# Patient Record
Sex: Male | Born: 1970 | Race: Black or African American | Marital: Single | Smoking: Current every day smoker
Health system: Southern US, Community
[De-identification: ages and names within clinical notes are randomized; demographics above are authoritative.]

## PROBLEM LIST (undated history)

## (undated) DIAGNOSIS — F209 Schizophrenia, unspecified: Secondary | ICD-10-CM

## (undated) DIAGNOSIS — F319 Bipolar disorder, unspecified: Secondary | ICD-10-CM

## (undated) HISTORY — DX: Bipolar disorder, unspecified: F31.9

## (undated) HISTORY — DX: Schizophrenia, unspecified: F20.9

---

## 2020-10-14 ENCOUNTER — Encounter: Payer: Self-pay | Admitting: Internal Medicine

## 2020-10-14 ENCOUNTER — Other Ambulatory Visit: Payer: Self-pay

## 2020-10-14 ENCOUNTER — Ambulatory Visit (INDEPENDENT_AMBULATORY_CARE_PROVIDER_SITE_OTHER): Payer: Self-pay | Admitting: Internal Medicine

## 2020-10-14 VITALS — BP 118/87 | HR 78 | Ht 65.0 in | Wt 199.6 lb

## 2020-10-14 DIAGNOSIS — Z6833 Body mass index (BMI) 33.0-33.9, adult: Secondary | ICD-10-CM

## 2020-10-14 DIAGNOSIS — F3176 Bipolar disorder, in full remission, most recent episode depressed: Secondary | ICD-10-CM

## 2020-10-14 DIAGNOSIS — E6609 Other obesity due to excess calories: Secondary | ICD-10-CM

## 2020-10-14 DIAGNOSIS — F209 Schizophrenia, unspecified: Secondary | ICD-10-CM

## 2020-10-14 NOTE — Assessment & Plan Note (Signed)
-   I encouraged the patient to lose weight.  - I educated them on making healthy dietary choices including eating more fruits and vegetables and less fried foods. - I encouraged the patient to exercise more, and educated on the benefits of exercise including weight loss, diabetes prevention, and hypertension prevention.   

## 2020-10-14 NOTE — Assessment & Plan Note (Signed)
Patient is under psychiatric care.

## 2020-10-14 NOTE — Assessment & Plan Note (Signed)
Patient does not seem to be hyper or depressed.  He denies any chest pain or shortness of breath.  There is no swelling or pedal edema.  Behavior is normal.  There is no flight of ideas or hallucinations noted.

## 2020-10-14 NOTE — Progress Notes (Signed)
New Patient Office Visit  Subjective:  Patient ID: Drew Moss, male    DOB: February 22, 1971  Age: 50 y.o. MRN: [id number]  CC:  Chief Complaint  Patient presents with  . New Patient (Initial Visit)    HPI   patient was seen in the office as a new patient he has been living in the restroom.  Prior to that he was seen in the [facility] , He is taking Abilify 5 mg tablet p.o. daily and Zyprexa 20 mg p.o. daily.  He smokes 1 pack/day.  Denies any history of drinking alcohol.  He denies any chest pain or shortness of breath.  Past Medical History:  Diagnosis Date  . Bipolar affective ([place])   . Schizophrenia ([place])      Current Outpatient Medications:  .  acetaminophen (TYLENOL) 325 MG tablet, Take 650 mg by mouth 3 (three) times daily., Disp: , Rfl:  .  ARIPiprazole (ABILIFY) 5 MG tablet, Take 5 mg by mouth daily., Disp: , Rfl:  .  OLANZapine (ZYPREXA) 20 MG tablet, Take 20 mg by mouth at bedtime., Disp: , Rfl:    History reviewed. No pertinent surgical history.  History reviewed. No pertinent family history.  Social History   Socioeconomic History  . Marital status: Single    Spouse name: Not on file  . Number of children: Not on file  . Years of education: Not on file  . Highest education level: Not on file  Occupational History  . Not on file  Tobacco Use  . Smoking status: Current Every Day Smoker  . Smokeless tobacco: Never Used  Substance and Sexual Activity  . Alcohol use: Not Currently  . Drug use: Never  . Sexual activity: Not Currently  Other Topics Concern  . Not on file  Social History Narrative  . Not on file   Social Determinants of Health   Financial Resource Strain: Not on file  Food Insecurity: Not on file  Transportation Needs: Not on file  Physical Activity: Not on file  Stress: Not on file  Social Connections: Not on file  Intimate Partner Violence: Not on file    ROS Review of Systems  Constitutional: Negative.   HENT: Negative.    Eyes: Negative.   Respiratory: Negative.   Cardiovascular: Negative.   Gastrointestinal: Negative.   Endocrine: Negative.   Genitourinary: Negative.   Musculoskeletal: Negative.   Skin: Negative.   Allergic/Immunologic: Negative.   Neurological: Negative.   Hematological: Negative.   Psychiatric/Behavioral: Negative.   All other systems reviewed and are negative.   Objective:   Today's Vitals: BP 118/87   Pulse 78   Ht 5\' 5"  (1.651 m)   Wt 199 lb 9.6 oz (90.5 kg)   BMI 33.22 kg/m   Physical Exam  Assessment & Plan:   Problem List Items Addressed This Visit      Other   Schizophrenia in remission [facility]) - Primary    Patient is under psychiatric care.      Bipolar 1 disorder, depressed, full remission ([place])    Patient does not seem to be hyper or depressed.  He denies any chest pain or shortness of breath.  There is no swelling or pedal edema.  Behavior is normal.  There is no flight of ideas or hallucinations noted.      Class 1 obesity due to excess calories without serious comorbidity with body mass index (BMI) of 33.0 to 33.9 in adult    - I encouraged the patient  to lose weight.  - I educated them on making healthy dietary choices including eating more fruits and vegetables and less fried foods. - I encouraged the patient to exercise more, and educated on the benefits of exercise including weight loss, diabetes prevention, and hypertension prevention.           Outpatient Encounter Medications as of 10/14/2020  Medication Sig  . acetaminophen (TYLENOL) 325 MG tablet Take 650 mg by mouth 3 (three) times daily.  . ARIPiprazole (ABILIFY) 5 MG tablet Take 5 mg by mouth daily.  Marland Kitchen OLANZapine (ZYPREXA) 20 MG tablet Take 20 mg by mouth at bedtime.   No facility-administered encounter medications on file as of 10/14/2020.    Follow-up: No follow-ups on file.   Cletis Athens, MD

## 2021-01-19 ENCOUNTER — Encounter: Payer: Self-pay | Admitting: Internal Medicine

## 2021-01-19 ENCOUNTER — Other Ambulatory Visit: Payer: Self-pay

## 2021-01-19 ENCOUNTER — Ambulatory Visit (INDEPENDENT_AMBULATORY_CARE_PROVIDER_SITE_OTHER): Payer: Medicare Other | Admitting: Internal Medicine

## 2021-01-19 VITALS — BP 111/92 | HR 82 | Ht 65.0 in | Wt 211.5 lb

## 2021-01-19 DIAGNOSIS — F209 Schizophrenia, unspecified: Secondary | ICD-10-CM

## 2021-01-19 DIAGNOSIS — E6609 Other obesity due to excess calories: Secondary | ICD-10-CM

## 2021-01-19 DIAGNOSIS — F3176 Bipolar disorder, in full remission, most recent episode depressed: Secondary | ICD-10-CM | POA: Diagnosis not present

## 2021-01-19 DIAGNOSIS — F17200 Nicotine dependence, unspecified, uncomplicated: Secondary | ICD-10-CM | POA: Diagnosis not present

## 2021-01-19 DIAGNOSIS — Z6833 Body mass index (BMI) 33.0-33.9, adult: Secondary | ICD-10-CM

## 2021-01-19 NOTE — Assessment & Plan Note (Signed)
Stable at the present time. 

## 2021-01-19 NOTE — Assessment & Plan Note (Signed)
No abnormal behaviors.

## 2021-01-19 NOTE — Assessment & Plan Note (Signed)

## 2021-01-19 NOTE — Progress Notes (Signed)
Established Patient Office Visit  Subjective:  Patient ID: Drew Moss, male    DOB: [birth year]  Age: 50 y.o. MRN: [id number]  CC:  Chief Complaint  Patient presents with   Follow-up    HPI  Drew Moss presents for check up Past Medical History:  Diagnosis Date   Bipolar affective ([place])    Schizophrenia ([place])     History reviewed. No pertinent surgical history.  History reviewed. No pertinent family history.  Social History   Socioeconomic History   Marital status: Single    Spouse name: Not on file   Number of children: Not on file   Years of education: Not on file   Highest education level: Not on file  Occupational History   Not on file  Tobacco Use   Smoking status: Every Day    Pack years: 0.00   Smokeless tobacco: Never  Substance and Sexual Activity   Alcohol use: Not Currently   Drug use: Never   Sexual activity: Not Currently  Other Topics Concern   Not on file  Social History Narrative   Not on file   Social Determinants of Health   Financial Resource Strain: Not on file  Food Insecurity: Not on file  Transportation Needs: Not on file  Physical Activity: Not on file  Stress: Not on file  Social Connections: Not on file  Intimate Partner Violence: Not on file     Current Outpatient Medications:    acetaminophen (TYLENOL) 325 MG tablet, Take 650 mg by mouth 3 (three) times daily., Disp: , Rfl:    ARIPiprazole (ABILIFY) 5 MG tablet, Take 5 mg by mouth daily., Disp: , Rfl:    OLANZapine (ZYPREXA) 20 MG tablet, Take 20 mg by mouth at bedtime., Disp: , Rfl:    No Known Allergies  ROS Review of Systems  Constitutional: Negative.   HENT: Negative.    Eyes: Negative.   Respiratory: Negative.    Cardiovascular: Negative.   Gastrointestinal: Negative.   Endocrine: Negative.   Genitourinary: Negative.   Musculoskeletal: Negative.   Skin: Negative.   Allergic/Immunologic: Negative.   Neurological: Negative.   Hematological:  Negative.   Psychiatric/Behavioral: Negative.    All other systems reviewed and are negative.    Objective:    Physical Exam Vitals reviewed.  Constitutional:      Appearance: Normal appearance.  HENT:     Mouth/Throat:     Mouth: Mucous membranes are moist.  Eyes:     Pupils: Pupils are equal, round, and reactive to light.  Neck:     Vascular: No carotid bruit.  Cardiovascular:     Rate and Rhythm: Normal rate and regular rhythm.     Pulses: Normal pulses.     Heart sounds: Normal heart sounds.  Pulmonary:     Effort: Pulmonary effort is normal.     Breath sounds: Normal breath sounds.  Abdominal:     General: Bowel sounds are normal.     Palpations: Abdomen is soft. There is no hepatomegaly, splenomegaly or mass.     Tenderness: There is no abdominal tenderness.     Hernia: No hernia is present.  Musculoskeletal:     Cervical back: Neck supple.     Right lower leg: No edema.     Left lower leg: No edema.  Skin:    Findings: No rash.  Neurological:     Mental Status: He is alert and oriented to person, place, and time.     Motor: No  weakness.  Psychiatric:        Mood and Affect: Mood normal.        Behavior: Behavior normal.    BP (!) 111/92   Pulse 82   Ht 5' 5"  (1.651 m)   Wt 211 lb 8 oz (95.9 kg)   BMI 35.20 kg/m  Wt Readings from Last 3 Encounters:  01/19/21 211 lb 8 oz (95.9 kg)  10/14/20 199 lb 9.6 oz (90.5 kg)     Health Maintenance Due  Topic Date Due   COVID-19 Vaccine (1) Never done   Pneumococcal Vaccine 50-31 Years old (1 - PCV) Never done   HIV Screening  Never done   Hepatitis C Screening  Never done   TETANUS/TDAP  Never done   COLONOSCOPY (Pts 45-40yr Insurance coverage will need to be confirmed)  Never done    There are no preventive care reminders to display for this patient.  No results found for: TSH No results found for: WBC, HGB, HCT, MCV, PLT No results found for: NA, K, CHLORIDE, CO2, GLUCOSE, BUN, CREATININE, BILITOT,  ALKPHOS, AST, ALT, PROT, ALBUMIN, CALCIUM, ANIONGAP, EGFR, GFR No results found for: CHOL No results found for: HDL No results found for: LDLCALC No results found for: TRIG No results found for: CHOLHDL No results found for: HGBA1C    Assessment & Plan:   Problem List Items Addressed This Visit       Other   [place]ia in [place]    No abnormal behaviors.       Bipolar 1 disorder, depressed, full remission ([place] - Primary    Stable at the present time       Class 1 obesity due to excess calories without serious comorbidity with body mass index (BMI) of 33.0 to 33.9 in adult    - I encouraged the patient to lose weight.  - I educated them on making healthy dietary choices including eating more fruits and vegetables and less fried foods. - I encouraged the patient to exercise more, and educated on the benefits of exercise including weight loss, diabetes prevention, and hypertension prevention.   Dietary counseling with a registered dietician  Referral to a weight management support group (e.g. Weight Watchers, Overeaters Anonymous)  If your BMI is greater than 29 or you have gained more than 15 pounds you should work on weight loss.  Attend a healthy cooking class        Smoker    - I instructed the patient to stop smoking and provided them with smoking cessation materials.  - I informed the patient that smoking puts them at increased risk for cancer, COPD, hypertension, and more.  - Informed the patient to seek help if they begin to have trouble breathing, develop chest pain, start to cough up blood, feel faint, or pass out.        No orders of the defined types were placed in this encounter.   Follow-up: No follow-ups on file.    JCletis Athens MD

## 2021-01-19 NOTE — Assessment & Plan Note (Signed)
-   I instructed the patient to stop smoking and provided them with smoking cessation materials.  - I informed the patient that smoking puts them at increased risk for cancer, COPD, hypertension, and more.  - Informed the patient to seek help if they begin to have trouble breathing, develop chest pain, start to cough up blood, feel faint, or pass out.  

## 2021-04-26 ENCOUNTER — Ambulatory Visit (INDEPENDENT_AMBULATORY_CARE_PROVIDER_SITE_OTHER): Payer: Medicare Other | Admitting: Internal Medicine

## 2021-04-26 ENCOUNTER — Other Ambulatory Visit: Payer: Self-pay

## 2021-04-26 ENCOUNTER — Encounter: Payer: Self-pay | Admitting: Internal Medicine

## 2021-04-26 VITALS — BP 115/81 | HR 69 | Temp 97.5°F | Ht 65.0 in | Wt 210.1 lb

## 2021-04-26 DIAGNOSIS — F209 Schizophrenia, unspecified: Secondary | ICD-10-CM

## 2021-04-26 DIAGNOSIS — F17200 Nicotine dependence, unspecified, uncomplicated: Secondary | ICD-10-CM

## 2021-04-26 DIAGNOSIS — F3176 Bipolar disorder, in full remission, most recent episode depressed: Secondary | ICD-10-CM | POA: Diagnosis not present

## 2021-04-26 DIAGNOSIS — Z6833 Body mass index (BMI) 33.0-33.9, adult: Secondary | ICD-10-CM

## 2021-04-26 DIAGNOSIS — E6609 Other obesity due to excess calories: Secondary | ICD-10-CM

## 2021-04-26 NOTE — Assessment & Plan Note (Signed)
Stable at the present time. 

## 2021-04-26 NOTE — Assessment & Plan Note (Signed)

## 2021-04-26 NOTE — Assessment & Plan Note (Signed)
-   I instructed the patient to stop smoking and provided them with smoking cessation materials.  - I informed the patient that smoking puts them at increased risk for cancer, COPD, hypertension, and more.  - Informed the patient to seek help if they begin to have trouble breathing, develop chest pain, start to cough up blood, feel faint, or pass out.  

## 2021-04-26 NOTE — Assessment & Plan Note (Signed)
Stable patient is not depressed or hyper

## 2021-04-26 NOTE — Progress Notes (Signed)
Established Patient Office Visit  Subjective:  Patient ID: Drew Moss, male    DOB: 1971/02/25  Age: 50 y.o. MRN: [id number]  CC:  Chief Complaint  Patient presents with   Follow-up    HPI  Drew Moss presents for check up  Past Medical History:  Diagnosis Date   Bipolar affective ([place])    Schizophrenia ([place])     History reviewed. No pertinent surgical history.  History reviewed. No pertinent family history.  Social History   Socioeconomic History   Marital status: Single    Spouse name: Not on file   Number of children: Not on file   Years of education: Not on file   Highest education level: Not on file  Occupational History   Not on file  Tobacco Use   Smoking status: Every Day   Smokeless tobacco: Never  Substance and Sexual Activity   Alcohol use: Not Currently   Drug use: Never   Sexual activity: Not Currently  Other Topics Concern   Not on file  Social History Narrative   Not on file   Social Determinants of Health   Financial Resource Strain: Not on file  Food Insecurity: Not on file  Transportation Needs: Not on file  Physical Activity: Not on file  Stress: Not on file  Social Connections: Not on file  Intimate Partner Violence: Not on file     Current Outpatient Medications:    acetaminophen (TYLENOL) 325 MG tablet, Take 650 mg by mouth 3 (three) times daily., Disp: , Rfl:    ARIPiprazole (ABILIFY) 5 MG tablet, Take 5 mg by mouth daily., Disp: , Rfl:    OLANZapine (ZYPREXA) 20 MG tablet, Take 20 mg by mouth at bedtime., Disp: , Rfl:    No Known Allergies  ROS Review of Systems  Constitutional: Negative.   HENT: Negative.    Eyes: Negative.   Respiratory: Negative.    Cardiovascular: Negative.   Gastrointestinal: Negative.   Endocrine: Negative.   Genitourinary: Negative.   Musculoskeletal: Negative.   Skin: Negative.   Allergic/Immunologic: Negative.   Neurological: Negative.   Hematological: Negative.    Psychiatric/Behavioral: Negative.    All other systems reviewed and are negative.    Objective:    Physical Exam Vitals reviewed.  Constitutional:      Appearance: Normal appearance.  HENT:     Mouth/Throat:     Mouth: Mucous membranes are moist.  Eyes:     Pupils: Pupils are equal, round, and reactive to light.  Neck:     Vascular: No carotid bruit.  Cardiovascular:     Rate and Rhythm: Normal rate and regular rhythm.     Pulses: Normal pulses.     Heart sounds: Normal heart sounds.  Pulmonary:     Effort: Pulmonary effort is normal.     Breath sounds: Normal breath sounds.  Abdominal:     General: Bowel sounds are normal.     Palpations: Abdomen is soft. There is no hepatomegaly, splenomegaly or mass.     Tenderness: There is no abdominal tenderness.     Hernia: No hernia is present.  Musculoskeletal:     Cervical back: Neck supple.     Right lower leg: No edema.     Left lower leg: No edema.  Skin:    Findings: No rash.  Neurological:     Mental Status: He is alert and oriented to person, place, and time.     Motor: No weakness.  Psychiatric:  Mood and Affect: Mood normal.        Behavior: Behavior normal.    BP 115/81   Pulse 69   Temp (!) 97.5 F (36.4 C)   Ht 5' 5"  (1.651 m)   Wt 210 lb 1.6 oz (95.3 kg)   BMI 34.96 kg/m  Wt Readings from Last 3 Encounters:  04/26/21 210 lb 1.6 oz (95.3 kg)  01/19/21 211 lb 8 oz (95.9 kg)  10/14/20 199 lb 9.6 oz (90.5 kg)     Health Maintenance Due  Topic Date Due   COVID-19 Vaccine (1) Never done   HIV Screening  Never done   Hepatitis C Screening  Never done   TETANUS/TDAP  Never done   COLONOSCOPY (Pts 45-1yr Insurance coverage will need to be confirmed)  Never done   INFLUENZA VACCINE  Never done    There are no preventive care reminders to display for this patient.  No results found for: TSH No results found for: WBC, HGB, HCT, MCV, PLT No results found for: NA, K, CHLORIDE, CO2, GLUCOSE,  BUN, CREATININE, BILITOT, ALKPHOS, AST, ALT, PROT, ALBUMIN, CALCIUM, ANIONGAP, EGFR, GFR No results found for: CHOL No results found for: HDL No results found for: LDLCALC No results found for: TRIG No results found for: CHOLHDL No results found for: HGBA1C    Assessment & Plan:   Problem List Items Addressed This Visit       Other   [place]    Stable patient is not depressed or hyper      Bipolar 1 disorder, depressed, full remission ([place]    Stable at the present time      Class 1 obesity due to excess calories without serious comorbidity with body mass index (BMI) of 33.0 to 33.9 in adult - Primary    - I encouraged the patient to lose weight.  - I educated them on making healthy dietary choices including eating more fruits and vegetables and less fried foods. - I encouraged the patient to exercise more, and educated on the benefits of exercise including weight loss, diabetes prevention, and hypertension prevention.   Dietary counseling with a registered dietician  Referral to a weight management support group (e.g. Weight Watchers, Overeaters Anonymous)  If your BMI is greater than 29 or you have gained more than 15 pounds you should work on weight loss.  Attend a healthy cooking class       Smoker    - I instructed the patient to stop smoking and provided them with smoking cessation materials.  - I informed the patient that smoking puts them at increased risk for cancer, COPD, hypertension, and more.  - Informed the patient to seek help if they begin to have trouble breathing, develop chest pain, start to cough up blood, feel faint, or pass out.       No orders of the defined types were placed in this encounter.   Follow-up: No follow-ups on file.    JCletis Athens MD

## 2021-07-09 ENCOUNTER — Ambulatory Visit (INDEPENDENT_AMBULATORY_CARE_PROVIDER_SITE_OTHER): Payer: Medicare Other

## 2021-07-09 DIAGNOSIS — Z0000 Encounter for general adult medical examination without abnormal findings: Secondary | ICD-10-CM | POA: Diagnosis not present

## 2021-07-10 NOTE — Progress Notes (Signed)
Subjective:   Drew Moss is a 50 y.o. male who presents for Medicare Annual/Subsequent preventive examination. I discussed the limitations of evaluation and management by telemedicine and the availability of in person appointments. The patient expressed understanding and agreed to proceed.   Visit performed by audio   Patient location: Home  Provider location: Home   Review of Systems    N/A       Objective:    There were no vitals filed for this visit. There is no height or weight on file to calculate BMI.  Advanced Directives 07/10/2021  Does Patient Have a Medical Advance Directive? Unable to assess, patient is non-responsive or altered mental status    Current Medications (verified) Outpatient Encounter Medications as of 07/09/2021  Medication Sig   acetaminophen (TYLENOL) 325 MG tablet Take 650 mg by mouth 3 (three) times daily.   ARIPiprazole (ABILIFY) 5 MG tablet Take 5 mg by mouth daily.   OLANZapine (ZYPREXA) 20 MG tablet Take 20 mg by mouth at bedtime.   No facility-administered encounter medications on file as of 07/09/2021.    Allergies (verified) Patient has no known allergies.   History: Past Medical History:  Diagnosis Date   Bipolar affective ([place])    Schizophrenia ([place])    History reviewed. No pertinent surgical history. History reviewed. No pertinent family history. Social History   Socioeconomic History   Marital status: Single    Spouse name: Not on file   Number of children: 0   Years of education: Not on file   Highest education level: 12th grade  Occupational History   Occupation: Disabed  Tobacco Use   Smoking status: Every Day   Smokeless tobacco: Never  Vaping Use   Vaping Use: Never used  Substance and Sexual Activity   Alcohol use: Not Currently   Drug use: Never   Sexual activity: Not Currently  Other Topics Concern   Not on file  Social History Narrative   Not on file   Social Determinants of Health   Financial  Resource Strain: Low Risk    Difficulty of Paying Living Expenses: Not hard at all  Food Insecurity: No Food Insecurity   Worried About Charity fundraiser in the Last Year: Never true   Conception in the Last Year: Never true  Transportation Needs: No Transportation Needs   Lack of Transportation (Medical): No   Lack of Transportation (Non-Medical): No  Physical Activity: Inactive   Days of Exercise per Week: 0 days   Minutes of Exercise per Session: 0 min  Stress: No Stress Concern Present   Feeling of Stress : Not at all  Social Connections: Socially Isolated   Frequency of Communication with Friends and Family: Once a week   Frequency of Social Gatherings with Friends and Family: Never   Attends Religious Services: Never   Printmaker: No   Attends Music therapist: Never   Marital Status: Never married    Tobacco Counseling Ready to quit: Not Answered Counseling given: Not Answered   Clinical Intake:  Pre-visit preparation completed: Yes  Pain : No/denies pain     Diabetes: No  How often do you need to have someone help you when you read instructions, pamphlets, or other written materials from your doctor or pharmacy?: 5 - Always What is the last grade level you completed in school?: 12 grade  Diabetic?No  Interpreter Needed?: No  Information entered by :: [place]  CMA   Activities of Daily Living No flowsheet data found.  Patient Care Team: Cletis Athens, MD as PCP - General (Internal Medicine)  Indicate any recent Medical Services you may have received from other than [facility] providers in the past year (date may be approximate).     Assessment:   This is a routine wellness examination for Drew Moss.  Hearing/Vision screen No results found.  Dietary issues and exercise activities discussed:     Goals Addressed   None    Depression Screen PHQ 2/9 Scores 07/10/2021  PHQ - 2 Score 0    Fall  Risk Fall Risk  07/10/2021  Falls in the past year? 0  Number falls in past yr: 0  Injury with Fall? 0  Risk for fall due to : No Fall Risks  Follow up Falls evaluation completed    [place]:  Any stairs in or around the home? No  If so, are there any without handrails? No  Home free of loose throw rugs in walkways, pet beds, electrical cords, etc? Yes  Adequate lighting in your home to reduce risk of falls? Yes   ASSISTIVE DEVICES UTILIZED TO PREVENT FALLS:  Life alert? No  Use of a cane, walker or w/c? No  Grab bars in the bathroom? Yes  Shower chair or bench in shower? Yes  Elevated toilet seat or a handicapped toilet? No   TIMED UP AND GO:  Was the test performed? No .  Length of time to ambulate 10 feet: 0 sec.     Cognitive Function:        Immunizations  There is no immunization history on file for this patient.  TDAP status: Up to date  Flu Vaccine status: Due, Education has been provided regarding the importance of this vaccine. Advised may receive this vaccine at local pharmacy or Health Dept. Aware to provide a copy of the vaccination record if obtained from local pharmacy or Health Dept. Verbalized acceptance and understanding.  Pneumococcal vaccine status: Due, Education has been provided regarding the importance of this vaccine. Advised may receive this vaccine at local pharmacy or Health Dept. Aware to provide a copy of the vaccination record if obtained from local pharmacy or Health Dept. Verbalized acceptance and understanding.  Covid-19 vaccine status: Information provided on how to obtain vaccines.   Qualifies for Shingles Vaccine? No   Zostavax completed No   Shingrix Completed?: No.    Education has been provided regarding the importance of this vaccine. Patient has been advised to call insurance company to determine out of pocket expense if they have not yet received this vaccine. Advised may also receive  vaccine at local pharmacy or Health Dept. Verbalized acceptance and understanding.  Screening Tests Health Maintenance  Topic Date Due   COVID-19 Vaccine (1) Never done   Pneumococcal Vaccine 63-4 Years old (1 - PCV) Never done   HIV Screening  Never done   Hepatitis C Screening  Never done   TETANUS/TDAP  Never done   COLONOSCOPY (Pts 45-55yrs Insurance coverage will need to be confirmed)  Never done   INFLUENZA VACCINE  Never done   HPV VACCINES  Aged Out    Health Maintenance  Health Maintenance Due  Topic Date Due   COVID-19 Vaccine (1) Never done   Pneumococcal Vaccine 40-95 Years old (1 - PCV) Never done   HIV Screening  Never done   Hepatitis C Screening  Never done   TETANUS/TDAP  Never done   COLONOSCOPY (Pts 45-66yrs Insurance coverage will need to be confirmed)  Never done   INFLUENZA VACCINE  Never done    Colonoscopy : Patient is not at age requirement  Lung Cancer Screening: (Low Dose CT Chest recommended if Age 42-80 years, 30 pack-year currently smoking OR have quit w/in 15years.) does not qualify.   Lung Cancer Screening Referral: No  Additional Screening:  Hepatitis C Screening: does qualify; Completed No  Vision Screening: Recommended annual ophthalmology exams for early detection of glaucoma and other disorders of the eye. Is the patient up to date with their annual eye exam?  No  Who is the provider or what is the name of the office in which the patient attends annual eye exams? [facility] If pt is not established with a provider, would they like to be referred to a provider to establish care? No .   Dental Screening: Recommended annual dental exams for proper oral hygiene  Community Resource Referral / Chronic Care Management: CRR required this visit?  No   CCM required this visit?  No      Plan:     I have personally reviewed and noted the following in the patient's chart:   Medical and social history Use of alcohol,  tobacco or illicit drugs  Current medications and supplements including opioid prescriptions. Patient is not currently taking opioid prescriptions. Functional ability and status Nutritional status Physical activity Advanced directives List of other physicians Hospitalizations, surgeries, and ER visits in previous 12 months Vitals Screenings to include cognitive, depression, and falls Referrals and appointments  In addition, I have reviewed and discussed with patient certain preventive protocols, quality metrics, and best practice recommendations. A written personalized care plan for preventive services as well as general preventive health recommendations were provided to patient.     Renato Gails, [place]   07/10/2021   Nurse Notes: This audio visit was done with patient's caregiver with patient present. Caregiver is aware of patient's caregaps and plans to bring patient in the office for labs and vaccinations that are due.

## 2021-07-11 NOTE — Progress Notes (Signed)
I have reviewed this visit and agree with the documentation.   

## 2021-07-12 ENCOUNTER — Other Ambulatory Visit: Payer: Self-pay

## 2021-07-12 DIAGNOSIS — Z1211 Encounter for screening for malignant neoplasm of colon: Secondary | ICD-10-CM

## 2021-07-13 ENCOUNTER — Ambulatory Visit (INDEPENDENT_AMBULATORY_CARE_PROVIDER_SITE_OTHER): Payer: Medicare Other | Admitting: *Deleted

## 2021-07-13 DIAGNOSIS — Z0000 Encounter for general adult medical examination without abnormal findings: Secondary | ICD-10-CM

## 2021-07-13 DIAGNOSIS — Z23 Encounter for immunization: Secondary | ICD-10-CM

## 2021-07-13 LAB — HM HEPATITIS C SCREENING LAB: HM Hepatitis Screen: NEGATIVE

## 2021-07-14 LAB — COMPLETE METABOLIC PANEL WITH GFR
AG Ratio: 1.2 (calc) (ref 1.0–2.5)
ALT: 11 U/L (ref 9–46)
AST: 17 U/L (ref 10–40)
Albumin: 4.3 g/dL (ref 3.6–5.1)
Alkaline phosphatase (APISO): 75 U/L (ref 36–130)
BUN: 8 mg/dL (ref 7–25)
CO2: 22 mmol/L (ref 20–32)
Calcium: 9.5 mg/dL (ref 8.6–10.3)
Chloride: 101 mmol/L (ref 98–110)
Creat: 0.97 mg/dL (ref 0.60–1.29)
Globulin: 3.7 g/dL (calc) (ref 1.9–3.7)
Glucose, Bld: 82 mg/dL (ref 65–99)
Potassium: 4.3 mmol/L (ref 3.5–5.3)
Sodium: 136 mmol/L (ref 135–146)
Total Bilirubin: 0.7 mg/dL (ref 0.2–1.2)
Total Protein: 8 g/dL (ref 6.1–8.1)
eGFR: 97 mL/min/{1.73_m2} (ref >=60)

## 2021-07-14 LAB — LIPID PANEL
Cholesterol: 169 mg/dL (ref <200)
HDL: 45 mg/dL (ref >=40)
LDL Cholesterol (Calc): 106 mg/dL (calc) — ABNORMAL HIGH
Non-HDL Cholesterol (Calc): 124 mg/dL (calc) (ref <130)
Total CHOL/HDL Ratio: 3.8 (calc) (ref <5.0)
Triglycerides: 86 mg/dL (ref <150)

## 2021-07-14 LAB — CBC WITH DIFFERENTIAL/PLATELET
Absolute Monocytes: 334 cells/uL (ref 200–950)
Basophils Absolute: 52 cells/uL (ref 0–200)
Basophils Relative: 1.1 %
Eosinophils Absolute: 52 cells/uL (ref 15–500)
Eosinophils Relative: 1.1 %
HCT: 47.8 % (ref 38.5–50.0)
Hemoglobin: 16.3 g/dL (ref 13.2–17.1)
Lymphs Abs: 1575 cells/uL (ref 850–3900)
MCH: 29.7 pg (ref 27.0–33.0)
MCHC: 34.1 g/dL (ref 32.0–36.0)
MCV: 87.1 fL (ref 80.0–100.0)
MPV: 11.6 fL (ref 7.5–12.5)
Monocytes Relative: 7.1 %
Neutro Abs: 2688 cells/uL (ref 1500–7800)
Neutrophils Relative %: 57.2 %
Platelets: 189 10*3/uL (ref 140–400)
RBC: 5.49 10*6/uL (ref 4.20–5.80)
RDW: 13.5 % (ref 11.0–15.0)
Total Lymphocyte: 33.5 %
WBC: 4.7 10*3/uL (ref 3.8–10.8)

## 2021-07-14 LAB — HEPATITIS C ANTIBODY
Hepatitis C Ab: NONREACTIVE
SIGNAL TO CUT-OFF: 0.09 (ref <1.00)

## 2021-07-14 LAB — HIV ANTIBODY (ROUTINE TESTING W REFLEX): HIV 1&2 Ab, 4th Generation: NONREACTIVE

## 2021-07-14 LAB — PSA: PSA: 0.53 ng/mL (ref <=4.00)

## 2021-07-14 LAB — TSH: TSH: 2.25 mIU/L (ref 0.40–4.50)

## 2021-07-15 ENCOUNTER — Encounter: Payer: Self-pay | Admitting: *Deleted

## 2021-07-15 ENCOUNTER — Other Ambulatory Visit: Payer: Self-pay

## 2021-07-15 DIAGNOSIS — Z1211 Encounter for screening for malignant neoplasm of colon: Secondary | ICD-10-CM

## 2021-07-15 MED ORDER — NA SULFATE-K SULFATE-MG SULF 17.5-3.13-1.6 GM/177ML PO SOLN: 1.0000 | Freq: Once | ORAL | 0 refills | Status: AC

## 2021-07-15 NOTE — Progress Notes (Signed)
Gastroenterology Pre-Procedure Review  Request Date: 08/17/2021 Requesting Physician: Dr. Allen Norris  PATIENT REVIEW QUESTIONS: The patient responded to the following health history questions as indicated:  Spoke with Skeet Simmer the owner of [facility]. She answered questions on behalf of patient.  1. Are you having any GI issues? no 2. Do you have a personal history of Polyps? no 3. Do you have a family history of Colon Cancer or Polyps? no 4. Diabetes Mellitus? no 5. Joint replacements in the past 12 months?no 6. Major health problems in the past 3 months?no 7. Any artificial heart valves, MVP, or defibrillator?no    MEDICATIONS & ALLERGIES:    Patient reports the following regarding taking any anticoagulation/antiplatelet therapy:   Plavix, Coumadin, Eliquis, Xarelto, Lovenox, Pradaxa, Brilinta, or Effient? no Aspirin? no  Patient confirms/reports the following medications:  Current Outpatient Medications  Medication Sig Dispense Refill   acetaminophen (TYLENOL) 325 MG tablet Take 650 mg by mouth 3 (three) times daily.     ARIPiprazole (ABILIFY) 5 MG tablet Take 5 mg by mouth daily.     OLANZapine (ZYPREXA) 20 MG tablet Take 20 mg by mouth at bedtime.     No current facility-administered medications for this visit.    Patient confirms/reports the following allergies:  No Known Allergies  No orders of the defined types were placed in this encounter.   AUTHORIZATION INFORMATION Primary Insurance: 1D#: Group #:  Secondary Insurance: 1D#: Group #:  SCHEDULE INFORMATION: Date: 08/17/2021 Time: Location: [facility]

## 2021-08-16 ENCOUNTER — Encounter: Payer: Self-pay | Admitting: Gastroenterology

## 2021-08-17 ENCOUNTER — Ambulatory Visit: Payer: Medicare Other | Admitting: Registered Nurse

## 2021-08-17 ENCOUNTER — Encounter: Payer: Self-pay | Admitting: Gastroenterology

## 2021-08-17 ENCOUNTER — Other Ambulatory Visit: Payer: Self-pay

## 2021-08-17 ENCOUNTER — Encounter
Admission: RE | Disposition: A | Discharge status: Home / Self Care | Payer: Self-pay | Source: Home / Self Care | Attending: Gastroenterology

## 2021-08-17 ENCOUNTER — Ambulatory Visit
Admission: RE | Admit: 2021-08-17 | Discharge: 2021-08-17 | Disposition: A | Discharge status: Home / Self Care | Payer: Medicare Other | Attending: Gastroenterology | Admitting: Gastroenterology

## 2021-08-17 DIAGNOSIS — Z0000 Encounter for general adult medical examination without abnormal findings: Secondary | ICD-10-CM

## 2021-08-17 DIAGNOSIS — Z1211 Encounter for screening for malignant neoplasm of colon: Secondary | ICD-10-CM | POA: Diagnosis not present

## 2021-08-17 DIAGNOSIS — D126 Benign neoplasm of colon, unspecified: Secondary | ICD-10-CM | POA: Diagnosis not present

## 2021-08-17 DIAGNOSIS — F17210 Nicotine dependence, cigarettes, uncomplicated: Secondary | ICD-10-CM | POA: Diagnosis not present

## 2021-08-17 DIAGNOSIS — K635 Polyp of colon: Secondary | ICD-10-CM | POA: Diagnosis not present

## 2021-08-17 DIAGNOSIS — K621 Rectal polyp: Secondary | ICD-10-CM

## 2021-08-17 HISTORY — PX: COLONOSCOPY WITH PROPOFOL: SHX5780

## 2021-08-17 SURGERY — COLONOSCOPY WITH PROPOFOL
Anesthesia: General

## 2021-08-17 MED ORDER — LIDOCAINE HCL (CARDIAC) PF 100 MG/5ML IV SOSY
PREFILLED_SYRINGE | INTRAVENOUS | Status: DC | PRN
  Administered 2021-08-17: 100 mg via INTRAVENOUS

## 2021-08-17 MED ORDER — SODIUM CHLORIDE 0.9 % IV SOLN: INTRAVENOUS | Status: DC

## 2021-08-17 MED ORDER — PROPOFOL 10 MG/ML IV BOLUS
INTRAVENOUS | Status: DC | PRN
Start: 2021-08-17 — End: 2021-08-17
  Administered 2021-08-17: 90 mg via INTRAVENOUS

## 2021-08-17 MED ORDER — EPHEDRINE SULFATE 50 MG/ML IJ SOLN
INTRAMUSCULAR | Status: DC | PRN
Start: 2021-08-17 — End: 2021-08-17
  Administered 2021-08-17 (×2): 5 mg via INTRAVENOUS
  Administered 2021-08-17: 10 mg via INTRAVENOUS

## 2021-08-17 MED ORDER — DEXMEDETOMIDINE HCL 200 MCG/2ML IV SOLN
INTRAVENOUS | Status: DC | PRN
  Administered 2021-08-17: 12 ug via INTRAVENOUS

## 2021-08-17 MED ORDER — PROPOFOL 500 MG/50ML IV EMUL
INTRAVENOUS | Status: DC | PRN
  Administered 2021-08-17: 150 ug/kg/min via INTRAVENOUS

## 2021-08-17 NOTE — Op Note (Signed)
[facility] Patient Name: Drew Moss Procedure Date: 08/17/2021 9:27 AM MRN: [id number] Account #: [id number] Date of Birth: [birth year] Admit Type: Outpatient Age: 51 Room: [facility] ENDO ROOM 4 Gender: Male Note Status: Finalized Instrument Name: Jasper Riling [id number] Procedure:             Colonoscopy Indications:           Screening for colorectal malignant neoplasm Providers:             Lucilla Lame MD, MD Referring MD:          Cletis Athens, MD (Referring MD) Medicines:             Propofol per Anesthesia Complications:         No immediate complications. Procedure:             Pre-Anesthesia Assessment:                        - Prior to the procedure, a History and Physical was                         performed, and patient medications and allergies were                         reviewed. The patient's tolerance of previous                         anesthesia was also reviewed. The risks and benefits                         of the procedure and the sedation options and risks                         were discussed with the patient. All questions were                         answered, and informed consent was obtained. Prior                         Anticoagulants: The patient has taken no previous                         anticoagulant or antiplatelet agents. ASA Grade                         Assessment: II - A patient with mild systemic disease.                         After reviewing the risks and benefits, the patient                         was deemed in satisfactory condition to undergo the                         procedure.                        After obtaining informed consent, the colonoscope was  passed under direct vision. Throughout the procedure,                         the patient's blood pressure, pulse, and oxygen                         saturations were monitored continuously. The                          Colonoscope was introduced through the anus and                         advanced to the the cecum, identified by appendiceal                         orifice and ileocecal valve. The colonoscopy was                         performed without difficulty. The patient tolerated                         the procedure well. The quality of the bowel                         preparation was excellent. Findings:      The perianal and digital rectal examinations were normal.      Eleven sessile polyps were found in the cecum. The polyps were 3 to 7 mm       in size. These polyps were removed with a cold snare. Resection and       retrieval were complete.      Five sessile polyps were found in the ascending colon. The polyps were 4       to 7 mm in size. These polyps were removed with a cold snare. Resection       and retrieval were complete.      Four sessile polyps were found in the descending colon. The polyps were       4 to 5 mm in size. These polyps were removed with a cold snare.       Resection and retrieval were complete.      A 3 mm polyp was found in the rectum. The polyp was sessile. The polyp       was removed with a cold snare. Resection and retrieval were complete. Impression:            - Eleven 3 to 7 mm polyps in the cecum, removed with a                         cold snare. Resected and retrieved.                        - Five 4 to 7 mm polyps in the ascending colon,                         removed with a cold snare. Resected and retrieved.                        - Four 4 to 5 mm polyps in the descending colon,  removed with a cold snare. Resected and retrieved.                        - One 3 mm polyp in the rectum, removed with a cold                         snare. Resected and retrieved. Recommendation:        - Discharge patient to home.                        - Resume previous diet.                        - Continue present medications.                         - Await pathology results.                        - If the pathology report reveals adenomatous tissue,                         then repeat the colonoscopy for surveillance in 1 year. Procedure Code(s):     --- Professional ---                        [phone], Colonoscopy, flexible; with removal of                         tumor(s), polyp(s), or other lesion(s) by snare                         technique Diagnosis Code(s):     --- Professional ---                        Z12.11, Encounter for screening for malignant neoplasm                         of colon                        K63.5, Polyp of colon CPT copyright 2019 American Medical Association. All rights reserved. The codes documented in this report are preliminary and upon coder review may  be revised to meet current compliance requirements. Lucilla Lame MD, MD 08/17/2021 10:19:57 AM This report has been signed electronically. Number of Addenda: 0 Note Initiated On: 08/17/2021 9:27 AM Scope Withdrawal Time: 0 hours 14 minutes 22 seconds  Total Procedure Duration: 0 hours 17 minutes 24 seconds  Estimated Blood Loss:  Estimated blood loss: none.      [facility]

## 2021-08-17 NOTE — H&P (Signed)
Lucilla Lame, MD [address] [place], [place] [address] Phone: [phone] Fax : [phone]  Primary Care Physician:  Cletis Athens, MD Primary Gastroenterologist:  Dr. Allen Norris  Pre-Procedure History & Physical: HPI:  Drew Moss is a 51 y.o. male is here for a screening colonoscopy.   Past Medical History:  Diagnosis Date   Bipolar affective ([place])    Schizophrenia ([place])     History reviewed. No pertinent surgical history.  Prior to Admission medications   Medication Sig Start Date End Date Taking? Authorizing Provider  acetaminophen (TYLENOL) 325 MG tablet Take 650 mg by mouth 3 (three) times daily.   Yes [provider]  ARIPiprazole (ABILIFY) 5 MG tablet Take 5 mg by mouth daily.   Yes [provider]  OLANZapine (ZYPREXA) 20 MG tablet Take 20 mg by mouth at bedtime.   Yes [provider]    Allergies as of 07/15/2021   (No Known Allergies)    History reviewed. No pertinent family history.  Social History   Socioeconomic History   Marital status: Single    Spouse name: Not on file   Number of children: 0   Years of education: Not on file   Highest education level: 12th grade  Occupational History   Occupation: Disabed  Tobacco Use   Smoking status: Every Day    Packs/day: 0.50    Types: Cigarettes   Smokeless tobacco: Never  Vaping Use   Vaping Use: Never used  Substance and Sexual Activity   Alcohol use: Not Currently   Drug use: Never   Sexual activity: Not Currently  Other Topics Concern   Not on file  Social History Narrative   Not on file   Social Determinants of Health   Financial Resource Strain: Low Risk    Difficulty of Paying Living Expenses: Not hard at all  Food Insecurity: No Food Insecurity   Worried About Charity fundraiser in the Last Year: Never true   [place] in the Last Year: Never true  Transportation Needs: No Transportation Needs   Lack of Transportation (Medical): No    Lack of Transportation (Non-Medical): No  Physical Activity: Inactive   Days of Exercise per Week: 0 days   Minutes of Exercise per Session: 0 min  Stress: No Stress Concern Present   Feeling of Stress : Not at all  Social Connections: Socially Isolated   Frequency of Communication with Friends and Family: Once a week   Frequency of Social Gatherings with Friends and Family: Never   Attends Religious Services: Never   Marine scientist or Organizations: No   Attends Music therapist: Never   Marital Status: Never married  Human resources officer Violence: Not At Risk   Fear of Current or Ex-Partner: No   Emotionally Abused: No   Physically Abused: No   Sexually Abused: No    Review of Systems: See HPI, otherwise negative ROS  Physical Exam: BP (!) 135/95    Pulse 62    Temp (!) 97.1 F (36.2 C) (Temporal)    Resp 18    Ht 5' 4.5" (1.638 m)    Wt 95.3 kg    SpO2 100%    BMI 35.49 kg/m  General:   Alert,  pleasant and cooperative in NAD Head:  Normocephalic and atraumatic. Neck:  Supple; no masses or thyromegaly. Lungs:  Clear throughout to auscultation.    Heart:  Regular rate and rhythm. Abdomen:  Soft,  nontender and nondistended. Normal bowel sounds, without guarding, and without rebound.   Neurologic:  Alert and  oriented x4;  grossly normal neurologically.  Impression/Plan: Drew Moss is now here to undergo a screening colonoscopy.  Risks, benefits, and alternatives regarding colonoscopy have been reviewed with the patient.  Questions have been answered.  All parties agreeable.

## 2021-08-17 NOTE — Anesthesia Postprocedure Evaluation (Signed)
Anesthesia Post Note  Patient: Drew Moss  Procedure(s) Performed: COLONOSCOPY WITH PROPOFOL  Patient location during evaluation: Endoscopy Anesthesia Type: General Level of consciousness: awake and alert Pain management: pain level controlled Vital Signs Assessment: post-procedure vital signs reviewed and stable Respiratory status: spontaneous breathing, nonlabored ventilation, respiratory function stable and patient connected to nasal cannula oxygen Cardiovascular status: blood pressure returned to baseline and stable Postop Assessment: no apparent nausea or vomiting Anesthetic complications: no   No notable events documented.   Last Vitals:  Vitals:   08/17/21 1031 08/17/21 1051  BP: (!) 84/63 102/78  Pulse:    Resp:    Temp:    SpO2:      Last Pain:  Vitals:   08/17/21 1051  TempSrc:   PainSc: 0-No pain                 Arita Miss

## 2021-08-17 NOTE — Transfer of Care (Signed)
Immediate Anesthesia Transfer of Care Note  Patient: Drew Moss  Procedure(s) Performed: COLONOSCOPY WITH PROPOFOL  Patient Location: Endoscopy Unit  Anesthesia Type:General  Level of Consciousness: drowsy  Airway & Oxygen Therapy: Patient Spontanous Breathing  Post-op Assessment: Report given to RN and Post -op Vital signs reviewed and stable  Post vital signs: Reviewed and stable  Last Vitals:  Vitals Value Taken Time  BP    Temp    Pulse 81 08/17/21 1022  Resp 21 08/17/21 1022  SpO2 96 % 08/17/21 1022  Vitals shown include unvalidated device data.  Last Pain:  Vitals:   08/17/21 0933  TempSrc: Temporal  PainSc: 0-No pain         Complications: No notable events documented.

## 2021-08-17 NOTE — Anesthesia Preprocedure Evaluation (Signed)
Anesthesia Evaluation  Patient identified by MRN, date of birth, ID band Patient awake    Reviewed: Allergy & Precautions, NPO status , Patient's Chart, lab work & pertinent test results  History of Anesthesia Complications Negative for: history of anesthetic complications  Airway Mallampati: II  TM Distance: >3 FB Neck ROM: Full    Dental  (+) Poor Dentition, Chipped, Missing,    Pulmonary neg sleep apnea, neg COPD, Current SmokerPatient did not abstain from smoking.,    Pulmonary exam normal breath sounds clear to auscultation       Cardiovascular Exercise Tolerance: Good METS(-) hypertension(-) CAD and (-) Past MI negative cardio ROS  (-) dysrhythmias  Rhythm:Regular Rate:Normal - Systolic murmurs    Neuro/Psych PSYCHIATRIC DISORDERS Bipolar Disorder Schizophrenia negative neurological ROS     GI/Hepatic neg GERD  ,(+)     (-) substance abuse  ,   Endo/Other  neg diabetes  Renal/GU negative Renal ROS     Musculoskeletal   Abdominal   Peds  Hematology   Anesthesia Other Findings Past Medical History: No date: Bipolar affective ([place]) No date: Schizophrenia ([place])  Reproductive/Obstetrics                             Anesthesia Physical Anesthesia Plan  ASA: 2  Anesthesia Plan: General   Post-op Pain Management: Minimal or no pain anticipated   Induction: Intravenous  PONV Risk Score and Plan: 2 and Propofol infusion, TIVA and Ondansetron  Airway Management Planned: Nasal Cannula  Additional Equipment: None  Intra-op Plan:   Post-operative Plan:   Informed Consent: I have reviewed the patients History and Physical, chart, labs and discussed the procedure including the risks, benefits and alternatives for the proposed anesthesia with the patient or authorized representative who has indicated his/her understanding and acceptance.     Dental advisory given  Plan  Discussed with: CRNA and Surgeon  Anesthesia Plan Comments: (Discussed risks of anesthesia with patient, including possibility of difficulty with spontaneous ventilation under anesthesia necessitating airway intervention, PONV, and rare risks such as cardiac or respiratory or neurological events, and allergic reactions. Discussed the role of CRNA in patient's perioperative care. Patient understands. Patient counseled on benefits of smoking cessation, and increased perioperative risks associated with continued smoking. )        Anesthesia Quick Evaluation

## 2021-08-18 LAB — SURGICAL PATHOLOGY

## 2021-08-19 ENCOUNTER — Encounter: Payer: Self-pay | Admitting: Gastroenterology

## 2021-10-26 ENCOUNTER — Ambulatory Visit
Admission: RE | Admit: 2021-10-26 | Discharge: 2021-10-26 | Disposition: A | Discharge status: Home / Self Care | Payer: Medicare Other | Source: Ambulatory Visit | Attending: Internal Medicine | Admitting: Internal Medicine

## 2021-10-26 ENCOUNTER — Ambulatory Visit
Admission: RE | Admit: 2021-10-26 | Discharge: 2021-10-26 | Disposition: A | Discharge status: Home / Self Care | Payer: Medicare Other | Attending: Internal Medicine | Admitting: Internal Medicine

## 2021-10-26 ENCOUNTER — Encounter: Payer: Self-pay | Admitting: Internal Medicine

## 2021-10-26 ENCOUNTER — Other Ambulatory Visit
Admission: RE | Admit: 2021-10-26 | Discharge: 2021-10-26 | Disposition: A | Discharge status: Home / Self Care | Payer: Medicare Other | Source: Home / Self Care | Attending: Internal Medicine | Admitting: Internal Medicine

## 2021-10-26 ENCOUNTER — Other Ambulatory Visit: Payer: Self-pay

## 2021-10-26 ENCOUNTER — Ambulatory Visit (INDEPENDENT_AMBULATORY_CARE_PROVIDER_SITE_OTHER): Payer: Medicare Other | Admitting: Internal Medicine

## 2021-10-26 VITALS — BP 137/97 | HR 52 | Ht 64.5 in | Wt 197.1 lb

## 2021-10-26 DIAGNOSIS — R9431 Abnormal electrocardiogram [ECG] [EKG]: Secondary | ICD-10-CM | POA: Diagnosis not present

## 2021-10-26 DIAGNOSIS — E6609 Other obesity due to excess calories: Secondary | ICD-10-CM | POA: Diagnosis not present

## 2021-10-26 DIAGNOSIS — F209 Schizophrenia, unspecified: Secondary | ICD-10-CM | POA: Diagnosis not present

## 2021-10-26 DIAGNOSIS — R079 Chest pain, unspecified: Secondary | ICD-10-CM | POA: Diagnosis not present

## 2021-10-26 DIAGNOSIS — F3176 Bipolar disorder, in full remission, most recent episode depressed: Secondary | ICD-10-CM | POA: Diagnosis not present

## 2021-10-26 DIAGNOSIS — F17200 Nicotine dependence, unspecified, uncomplicated: Secondary | ICD-10-CM | POA: Diagnosis not present

## 2021-10-26 DIAGNOSIS — Z6833 Body mass index (BMI) 33.0-33.9, adult: Secondary | ICD-10-CM

## 2021-10-26 LAB — CBC WITH DIFFERENTIAL/PLATELET
Abs Immature Granulocytes: 0.01 10*3/uL (ref 0.00–0.07)
Basophils Absolute: 0.1 10*3/uL (ref 0.0–0.1)
Basophils Relative: 1 %
Eosinophils Absolute: 0.1 10*3/uL (ref 0.0–0.5)
Eosinophils Relative: 1 %
HCT: 45.6 % (ref 39.0–52.0)
Hemoglobin: 15.2 g/dL (ref 13.0–17.0)
Immature Granulocytes: 0 %
Lymphocytes Relative: 41 %
Lymphs Abs: 1.8 10*3/uL (ref 0.7–4.0)
MCH: 28.6 pg (ref 26.0–34.0)
MCHC: 33.3 g/dL (ref 30.0–36.0)
MCV: 85.7 fL (ref 80.0–100.0)
Monocytes Absolute: 0.2 10*3/uL (ref 0.1–1.0)
Monocytes Relative: 5 %
Neutro Abs: 2.3 10*3/uL (ref 1.7–7.7)
Neutrophils Relative %: 52 %
Platelets: 203 10*3/uL (ref 150–400)
RBC: 5.32 MIL/uL (ref 4.22–5.81)
RDW: 13.2 % (ref 11.5–15.5)
WBC: 4.5 10*3/uL (ref 4.0–10.5)
nRBC: 0 % (ref 0.0–0.2)

## 2021-10-26 LAB — COMPREHENSIVE METABOLIC PANEL
ALT: 15 U/L (ref 0–44)
AST: 20 U/L (ref 15–41)
Albumin: 4.2 g/dL (ref 3.5–5.0)
Alkaline Phosphatase: 69 U/L (ref 38–126)
Anion gap: 9 (ref 5–15)
BUN: 12 mg/dL (ref 6–20)
CO2: 23 mmol/L (ref 22–32)
Calcium: 9.3 mg/dL (ref 8.9–10.3)
Chloride: 103 mmol/L (ref 98–111)
Creatinine, Ser: 0.99 mg/dL (ref 0.61–1.24)
GFR, Estimated: >60 mL/min (ref >60)
Glucose, Bld: 121 mg/dL — ABNORMAL HIGH (ref 70–99)
Potassium: 3.7 mmol/L (ref 3.5–5.1)
Sodium: 135 mmol/L (ref 135–145)
Total Bilirubin: 0.8 mg/dL (ref 0.3–1.2)
Total Protein: 7.9 g/dL (ref 6.5–8.1)

## 2021-10-26 LAB — D-DIMER, QUANTITATIVE: D-Dimer, Quant: <0.27 ug/mL-FEU (ref 0.00–0.50)

## 2021-10-26 LAB — TROPONIN I (HIGH SENSITIVITY): Troponin I (High Sensitivity): 3 ng/L (ref <18)

## 2021-10-26 NOTE — Assessment & Plan Note (Signed)
-   I instructed the patient to stop smoking and provided them with smoking cessation materials.  - I informed the patient that smoking puts them at increased risk for cancer, COPD, hypertension, and more.  - Informed the patient to seek help if they begin to have trouble breathing, develop chest pain, start to cough up blood, feel faint, or pass out.  

## 2021-10-26 NOTE — Assessment & Plan Note (Signed)
In full remission right now ?

## 2021-10-26 NOTE — Assessment & Plan Note (Signed)
Patient complaining of atypical chest pain the pain does not radiate to the shoulder or arm.  He smokes heavily.  His EKG is abnormal so we will do a chest x-ray on him and also do some blood test he was told to come back to see me in a day  ?

## 2021-10-26 NOTE — Assessment & Plan Note (Signed)

## 2021-10-26 NOTE — Assessment & Plan Note (Signed)
Stable at the present time. 

## 2021-10-26 NOTE — Progress Notes (Signed)
? ?Established Patient Office Visit ? ?Subjective:  ?Patient ID: Drew Moss, male    DOB: 09-26-1970  Age: 51 y.o. MRN: [id number] ? ?CC:  ?Chief Complaint  ?Patient presents with  ? Follow-up  ?  Patient is here for 1 month follow up  ? ? ?Chest Pain  ?This is a new problem. The current episode started in the past 7 days. The onset quality is sudden. The pain is at a severity of 7/10. The pain does not radiate. Pertinent negatives include no abdominal pain, back pain, cough, diaphoresis, dizziness, exertional chest pressure, fever, headaches, lower extremity edema, nausea, near-syncope, sputum production or vomiting.  ? ?Hance Caspers presents for chest pain ? ?Past Medical History:  ?Diagnosis Date  ? Bipolar affective ([place])   ? Schizophrenia ([place])   ? ? ?Past Surgical History:  ?Procedure Laterality Date  ? COLONOSCOPY WITH PROPOFOL N/A 08/17/2021  ? Procedure: COLONOSCOPY WITH PROPOFOL;  Surgeon: Lucilla Lame, MD;  Location: [facility];  Service: Endoscopy;  Laterality: N/A;  ? ? ?No family history on file. ? ?Social History  ? ?Socioeconomic History  ? Marital status: Single  ?  Spouse name: Not on file  ? Number of children: 0  ? Years of education: Not on file  ? Highest education level: 12th grade  ?Occupational History  ? Occupation: Disabed  ?Tobacco Use  ? Smoking status: Every Day  ?  Packs/day: 0.50  ?  Types: Cigarettes  ? Smokeless tobacco: Never  ?Vaping Use  ? Vaping Use: Never used  ?Substance and Sexual Activity  ? Alcohol use: Not Currently  ? Drug use: Never  ? Sexual activity: Not Currently  ?Other Topics Concern  ? Not on file  ?Social History Narrative  ? Not on file  ? ?Social Determinants of Health  ? ?Financial Resource Strain: Low Risk   ? Difficulty of Paying Living Expenses: Not hard at all  ?Food Insecurity: No Food Insecurity  ? Worried About Charity fundraiser in the Last Year: Never true  ? Ran Out of Food in the Last Year: Never true  ?Transportation Needs: No  Transportation Needs  ? Lack of Transportation (Medical): No  ? Lack of Transportation (Non-Medical): No  ?Physical Activity: Inactive  ? Days of Exercise per Week: 0 days  ? Minutes of Exercise per Session: 0 min  ?Stress: No Stress Concern Present  ? Feeling of Stress : Not at all  ?Social Connections: Socially Isolated  ? Frequency of Communication with Friends and Family: Once a week  ? Frequency of Social Gatherings with Friends and Family: Never  ? Attends Religious Services: Never  ? Active Member of Clubs or Organizations: No  ? Attends Archivist Meetings: Never  ? Marital Status: Never married  ?Intimate Partner Violence: Not At Risk  ? Fear of Current or Ex-Partner: No  ? Emotionally Abused: No  ? Physically Abused: No  ? Sexually Abused: No  ? ? ? ?Current Outpatient Medications:  ?  acetaminophen (TYLENOL) 325 MG tablet, Take 650 mg by mouth 3 (three) times daily., Disp: , Rfl:  ?  ARIPiprazole (ABILIFY) 5 MG tablet, Take 5 mg by mouth daily., Disp: , Rfl:  ?  OLANZapine (ZYPREXA) 20 MG tablet, Take 20 mg by mouth at bedtime., Disp: , Rfl:   ? ?No Known Allergies ? ?ROS ?Review of Systems  ?Constitutional:  Negative for diaphoresis and fever.  ?Respiratory:  Negative for cough and sputum production.   ?Cardiovascular:  Positive  for chest pain. Negative for near-syncope.  ?Gastrointestinal:  Negative for abdominal pain, nausea and vomiting.  ?Musculoskeletal:  Negative for back pain.  ?Neurological:  Negative for dizziness and headaches.  ? ?  ?Objective:  ?  ?Physical Exam ?Vitals reviewed.  ?Constitutional:   ?   Appearance: Normal appearance.  ?HENT:  ?   Mouth/Throat:  ?   Mouth: Mucous membranes are moist.  ?Eyes:  ?   Pupils: Pupils are equal, round, and reactive to light.  ?Neck:  ?   Vascular: No carotid bruit.  ?Cardiovascular:  ?   Rate and Rhythm: Normal rate and regular rhythm.  ?   Pulses: Normal pulses.  ?   Heart sounds: Normal heart sounds.  ?Pulmonary:  ?   Effort: Pulmonary  effort is normal.  ?   Breath sounds: Normal breath sounds.  ?Abdominal:  ?   General: Bowel sounds are normal.  ?   Palpations: Abdomen is soft. There is no hepatomegaly, splenomegaly or mass.  ?   Tenderness: There is no abdominal tenderness.  ?   Hernia: No hernia is present.  ?Musculoskeletal:  ?   Cervical back: Neck supple.  ?   Right lower leg: No edema.  ?   Left lower leg: No edema.  ?Skin: ?   Findings: No rash.  ?Neurological:  ?   Mental Status: He is alert and oriented to person, place, and time.  ?   Motor: No weakness.  ?Psychiatric:     ?   Mood and Affect: Mood normal.     ?   Behavior: Behavior normal.  ? ? ?BP (!) 137/97   Pulse (!) 52   Ht 5' 4.5" (1.638 m)   Wt 197 lb 1.6 oz (89.4 kg)   BMI 33.31 kg/m?  ?Wt Readings from Last 3 Encounters:  ?10/26/21 197 lb 1.6 oz (89.4 kg)  ?08/17/21 210 lb (95.3 kg)  ?04/26/21 210 lb 1.6 oz (95.3 kg)  ? ? ? ?Health Maintenance Due  ?Topic Date Due  ? COVID-19 Vaccine (1) Never done  ? ? ?There are no preventive care reminders to display for this patient. ? ?Lab Results  ?Component Value Date  ? TSH 2.25 07/13/2021  ? ?Lab Results  ?Component Value Date  ? WBC 4.7 07/13/2021  ? HGB 16.3 07/13/2021  ? HCT 47.8 07/13/2021  ? MCV 87.1 07/13/2021  ? PLT 189 07/13/2021  ? ?Lab Results  ?Component Value Date  ? NA 136 07/13/2021  ? K 4.3 07/13/2021  ? CO2 22 07/13/2021  ? GLUCOSE 82 07/13/2021  ? BUN 8 07/13/2021  ? CREATININE 0.97 07/13/2021  ? BILITOT 0.7 07/13/2021  ? AST 17 07/13/2021  ? ALT 11 07/13/2021  ? PROT 8.0 07/13/2021  ? CALCIUM 9.5 07/13/2021  ? EGFR 97 07/13/2021  ? ?Lab Results  ?Component Value Date  ? CHOL 169 07/13/2021  ? ?Lab Results  ?Component Value Date  ? HDL 45 07/13/2021  ? ?Lab Results  ?Component Value Date  ? LDLCALC 106 (H) 07/13/2021  ? ?Lab Results  ?Component Value Date  ? TRIG 86 07/13/2021  ? ?Lab Results  ?Component Value Date  ? CHOLHDL 3.8 07/13/2021  ? ?No results found for: HGBA1C ? ?  ?Assessment & Plan:  ?     There is  report of the electrocardiogram.  Electrocardiogram revealed nonspecific ST-T abnormalities suggestive of ischemia. ? ? ? ? ? ?Problem List Items Addressed This Visit   ? ?  ? Other  ?  Schizophrenia in remission [facility]) - Primary  ?  Stable at the present time ?  ?  ? Bipolar 1 disorder, depressed, full remission ([place])  ?  In full remission right now ?  ?  ? Class 1 obesity due to excess calories without serious comorbidity with body mass index (BMI) of 33.0 to 33.9 in adult  ?  - I encouraged the patient to lose weight.  ?- I educated them on making healthy dietary choices including eating more fruits and vegetables and less fried foods. ?- I encouraged the patient to exercise more, and educated on the benefits of exercise including weight loss, diabetes prevention, and hypertension prevention.  ? Dietary counseling with a registered dietician ? Referral to a weight management support group (e.g. Weight Watchers, Overeaters Anonymous) ?? If your BMI is greater than 29 or you have gained more than 15 pounds you should work on weight loss. ?? Attend a healthy cooking class  ?  ?  ? Smoker  ?  - I instructed the patient to stop smoking and provided them with smoking cessation materials.  ?- I informed the patient that smoking puts them at increased risk for cancer, COPD, hypertension, and more.  ?- Informed the patient to seek help if they begin to have trouble breathing, develop chest pain, start to cough up blood, feel faint, or pass out. ?  ?  ? Chest pain  ?  Patient complaining of atypical chest pain the pain does not radiate to the shoulder or arm.  He smokes heavily.  His EKG is abnormal so we will do a chest x-ray on him and also do some blood test he was told to come back to see me in a day  ?  ?  ? Relevant Orders  ? EKG 12-Lead  ? DG Chest 2 View  ? Abnormal EKG  ? Relevant Orders  ? DG Chest 2 View  ? CBC w/Diff/Platelet  ? COMPLETE METABOLIC PANEL WITH GFR  ? D-Dimer, Quantitative  ? Troponin I (High  Sensitivity)  ? ? ?No orders of the defined types were placed in this encounter. ? ? ?Follow-up: No follow-ups on file.  ? ? ?Cletis Athens, MD ?

## 2021-10-27 ENCOUNTER — Ambulatory Visit (INDEPENDENT_AMBULATORY_CARE_PROVIDER_SITE_OTHER): Payer: Medicare Other | Admitting: Internal Medicine

## 2021-10-27 ENCOUNTER — Encounter: Payer: Self-pay | Admitting: Internal Medicine

## 2021-10-27 VITALS — BP 130/82 | HR 81 | Ht 64.5 in | Wt 197.1 lb

## 2021-10-27 DIAGNOSIS — E6609 Other obesity due to excess calories: Secondary | ICD-10-CM

## 2021-10-27 DIAGNOSIS — F17200 Nicotine dependence, unspecified, uncomplicated: Secondary | ICD-10-CM | POA: Diagnosis not present

## 2021-10-27 DIAGNOSIS — R079 Chest pain, unspecified: Secondary | ICD-10-CM

## 2021-10-27 DIAGNOSIS — Z6833 Body mass index (BMI) 33.0-33.9, adult: Secondary | ICD-10-CM

## 2021-10-27 DIAGNOSIS — F209 Schizophrenia, unspecified: Secondary | ICD-10-CM

## 2021-10-27 NOTE — Assessment & Plan Note (Signed)
Stable

## 2021-10-27 NOTE — Assessment & Plan Note (Signed)
Repeat electrocardiogram does not show any progressive changes of myocardial ischemia, troponin is normal, chest x-ray is normal, patient was advised to lose weight and avoid sodas ?- I encouraged the patient to lose weight.  ?- I educated them on making healthy dietary choices including eating more fruits and vegetables and less fried foods. ?- I encouraged the patient to exercise more, and educated on the benefits of exercise including weight loss, diabetes prevention, and hypertension prevention.  ? Dietary counseling with a registered dietician ? Referral to a weight management support group (e.g. Weight Watchers, Overeaters Anonymous) ?? If your BMI is greater than 29 or you have gained more than 15 pounds you should work on weight loss. ?? Attend a healthy cooking class  ?

## 2021-10-27 NOTE — Assessment & Plan Note (Signed)

## 2021-10-27 NOTE — Progress Notes (Signed)
? ?Established Patient Office Visit ? ?Subjective:  ?Patient ID: Drew Moss, male    DOB: 02/02/1971  Age: 51 y.o. MRN: [id number] ? ?CC:  ?Chief Complaint  ?Patient presents with  ? lab results  ? xray results  ? ? ?HPI ? ?Drew Moss presents for follow-up chest pain and x-ray report ? ?Past Medical History:  ?Diagnosis Date  ? Bipolar affective ([place])   ? Schizophrenia ([place])   ? ? ?Past Surgical History:  ?Procedure Laterality Date  ? COLONOSCOPY WITH PROPOFOL N/A 08/17/2021  ? Procedure: COLONOSCOPY WITH PROPOFOL;  Surgeon: Lucilla Lame, MD;  Location: [facility] ENDOSCOPY;  Service: Endoscopy;  Laterality: N/A;  ? ? ?History reviewed. No pertinent family history. ? ?Social History  ? ?Socioeconomic History  ? Marital status: Single  ?  Spouse name: Not on file  ? Number of children: 0  ? Years of education: Not on file  ? Highest education level: 12th grade  ?Occupational History  ? Occupation: Disabed  ?Tobacco Use  ? Smoking status: Every Day  ?  Packs/day: 0.50  ?  Types: Cigarettes  ? Smokeless tobacco: Never  ?Vaping Use  ? Vaping Use: Never used  ?Substance and Sexual Activity  ? Alcohol use: Not Currently  ? Drug use: Never  ? Sexual activity: Not Currently  ?Other Topics Concern  ? Not on file  ?Social History Narrative  ? Not on file  ? ?Social Determinants of Health  ? ?Financial Resource Strain: Low Risk   ? Difficulty of Paying Living Expenses: Not hard at all  ?Food Insecurity: No Food Insecurity  ? Worried About Charity fundraiser in the Last Year: Never true  ? Ran Out of Food in the Last Year: Never true  ?Transportation Needs: No Transportation Needs  ? Lack of Transportation (Medical): No  ? Lack of Transportation (Non-Medical): No  ?Physical Activity: Inactive  ? Days of Exercise per Week: 0 days  ? Minutes of Exercise per Session: 0 min  ?Stress: No Stress Concern Present  ? Feeling of Stress : Not at all  ?Social Connections: Socially Isolated  ? Frequency of Communication with Friends and  Family: Once a week  ? Frequency of Social Gatherings with Friends and Family: Never  ? Attends Religious Services: Never  ? Active Member of Clubs or Organizations: No  ? Attends Archivist Meetings: Never  ? Marital Status: Never married  ?Intimate Partner Violence: Not At Risk  ? Fear of Current or Ex-Partner: No  ? Emotionally Abused: No  ? Physically Abused: No  ? Sexually Abused: No  ? ? ? ?Current Outpatient Medications:  ?  acetaminophen (TYLENOL) 325 MG tablet, Take 650 mg by mouth 3 (three) times daily., Disp: , Rfl:  ?  ARIPiprazole (ABILIFY) 5 MG tablet, Take 5 mg by mouth daily., Disp: , Rfl:  ?  OLANZapine (ZYPREXA) 20 MG tablet, Take 20 mg by mouth at bedtime., Disp: , Rfl:   ? ?No Known Allergies ? ?ROS ?Review of Systems  ?Constitutional: Negative.   ?HENT: Negative.    ?Eyes: Negative.   ?Respiratory: Negative.    ?Cardiovascular: Negative.   ?Gastrointestinal: Negative.   ?Endocrine: Negative.   ?Genitourinary: Negative.   ?Musculoskeletal: Negative.   ?Skin: Negative.   ?Allergic/Immunologic: Negative.   ?Neurological: Negative.   ?Hematological: Negative.   ?Psychiatric/Behavioral: Negative.    ?All other systems reviewed and are negative. ? ?  ?Objective:  ?  ?Physical Exam ?Vitals reviewed.  ?Constitutional:   ?  Appearance: Normal appearance.  ?HENT:  ?   Mouth/Throat:  ?   Mouth: Mucous membranes are moist.  ?Eyes:  ?   Pupils: Pupils are equal, round, and reactive to light.  ?Neck:  ?   Vascular: No carotid bruit.  ?Cardiovascular:  ?   Rate and Rhythm: Normal rate and regular rhythm.  ?   Pulses: Normal pulses.  ?   Heart sounds: Normal heart sounds.  ?Pulmonary:  ?   Effort: Pulmonary effort is normal.  ?   Breath sounds: Normal breath sounds.  ?Abdominal:  ?   General: Bowel sounds are normal.  ?   Palpations: Abdomen is soft. There is no hepatomegaly, splenomegaly or mass.  ?   Tenderness: There is no abdominal tenderness.  ?   Hernia: No hernia is present.  ?Musculoskeletal:   ?   Cervical back: Neck supple.  ?   Right lower leg: No edema.  ?   Left lower leg: No edema.  ?Skin: ?   Findings: No rash.  ?Neurological:  ?   Mental Status: He is alert and oriented to person, place, and time.  ?   Motor: No weakness.  ?Psychiatric:     ?   Mood and Affect: Mood normal.     ?   Behavior: Behavior normal.  ? ? ?BP 130/82   Pulse 81   Ht 5' 4.5" (1.638 m)   Wt 197 lb 1.6 oz (89.4 kg)   BMI 33.31 kg/m?  ?Wt Readings from Last 3 Encounters:  ?10/27/21 197 lb 1.6 oz (89.4 kg)  ?10/26/21 197 lb 1.6 oz (89.4 kg)  ?08/17/21 210 lb (95.3 kg)  ? ? ? ?Health Maintenance Due  ?Topic Date Due  ? COVID-19 Vaccine (1) Never done  ? ? ?There are no preventive care reminders to display for this patient. ? ?Lab Results  ?Component Value Date  ? TSH 2.25 07/13/2021  ? ?Lab Results  ?Component Value Date  ? WBC 4.5 10/26/2021  ? HGB 15.2 10/26/2021  ? HCT 45.6 10/26/2021  ? MCV 85.7 10/26/2021  ? PLT 203 10/26/2021  ? ?Lab Results  ?Component Value Date  ? NA 135 10/26/2021  ? K 3.7 10/26/2021  ? CO2 23 10/26/2021  ? GLUCOSE 121 (H) 10/26/2021  ? BUN 12 10/26/2021  ? CREATININE 0.99 10/26/2021  ? BILITOT 0.8 10/26/2021  ? ALKPHOS 69 10/26/2021  ? AST 20 10/26/2021  ? ALT 15 10/26/2021  ? PROT 7.9 10/26/2021  ? ALBUMIN 4.2 10/26/2021  ? CALCIUM 9.3 10/26/2021  ? ANIONGAP 9 10/26/2021  ? EGFR 97 07/13/2021  ? ?Lab Results  ?Component Value Date  ? CHOL 169 07/13/2021  ? ?Lab Results  ?Component Value Date  ? HDL 45 07/13/2021  ? ?Lab Results  ?Component Value Date  ? LDLCALC 106 (H) 07/13/2021  ? ?Lab Results  ?Component Value Date  ? TRIG 86 07/13/2021  ? ?Lab Results  ?Component Value Date  ? CHOLHDL 3.8 07/13/2021  ? ?No results found for: HGBA1C ? ?  ?Assessment & Plan:  ?Report of the electrocardiogram. ?Normal sinus rhythm ?Nonspecific ST-T changes ?No change since yesterday ? ?Problem List Items Addressed This Visit   ? ?  ? Other  ? Schizophrenia in remission [facility])  ?  Stable ?  ?  ? Class 1 obesity due  to excess calories without serious comorbidity with body mass index (BMI) of 33.0 to 33.9 in adult  ?  - I encouraged the patient to lose  weight.  ?- I educated them on making healthy dietary choices including eating more fruits and vegetables and less fried foods. ?- I encouraged the patient to exercise more, and educated on the benefits of exercise including weight loss, diabetes prevention, and hypertension prevention.  ? Dietary counseling with a registered dietician ? Referral to a weight management support group (e.g. Weight Watchers, Overeaters Anonymous) ?? If your BMI is greater than 29 or you have gained more than 15 pounds you should work on weight loss. ?? Attend a healthy cooking class  ?  ?  ? Smoker  ?  Counseled patient on the dangers of tobacco use, advised patient to stop smoking, and reviewed strategies to maximize success Smoking cessation instruction/counseling given:  counseled patient on the dangers of tobacco use, advised patient to stop smoking, and reviewed strategies to maximize success It is very important that pt quit smoking. There are various alternatives available to help with this difficult task, but first and foremost, pt must make a firm commitment and decision to quit. The nature of nicotine addiction is discussed. The usefulness of behavioral therapy is discussed and suggested.  The correct use, cost and side effects of nicotine replacement therapy such as gum or patches is discussed. Bupropion and its cost (sometimes not covered fully by insurance) and side effects are reviewed. The quit rates are discussed. I recommend pt not allow potential costs of treatment to deter ptfrom using nicotine replacement therapy or bupropion, as the long term economic and health benefits are obvious.  ?  ?  ? Chest pain - Primary  ?  Repeat electrocardiogram does not show any progressive changes of myocardial ischemia, troponin is normal, chest x-ray is normal, patient was advised to lose weight  and avoid sodas ?- I encouraged the patient to lose weight.  ?- I educated them on making healthy dietary choices including eating more fruits and vegetables and less fried foods. ?- I encouraged the pa

## 2021-10-27 NOTE — Assessment & Plan Note (Signed)

## 2021-12-01 ENCOUNTER — Ambulatory Visit: Payer: Medicaid Other | Admitting: Internal Medicine

## 2021-12-03 ENCOUNTER — Telehealth: Payer: Self-pay | Admitting: Internal Medicine

## 2021-12-03 NOTE — Telephone Encounter (Signed)
Pt called [facility], thinking he was calling Dr Jennette Kettle office.  Attempted to contact your office, however could not get an answer. ?Pt would like call back to schedule appointment w/ Dr Lavera Guise.   ?Pt would like to discuss w/ Dr Lavera Guise getting a surgery consult for enlargement of penis. ?Pt states he spoke with Medicaid, and they will approve this surgery. ?Please call. ?

## 2022-01-12 ENCOUNTER — Ambulatory Visit (INDEPENDENT_AMBULATORY_CARE_PROVIDER_SITE_OTHER): Payer: Medicare Other | Admitting: Internal Medicine

## 2022-01-12 ENCOUNTER — Encounter: Payer: Self-pay | Admitting: Internal Medicine

## 2022-01-12 VITALS — BP 134/90 | HR 63 | Ht 64.5 in | Wt 197.2 lb

## 2022-01-12 DIAGNOSIS — F17200 Nicotine dependence, unspecified, uncomplicated: Secondary | ICD-10-CM | POA: Diagnosis not present

## 2022-01-12 DIAGNOSIS — F3176 Bipolar disorder, in full remission, most recent episode depressed: Secondary | ICD-10-CM | POA: Diagnosis not present

## 2022-01-12 DIAGNOSIS — Z1211 Encounter for screening for malignant neoplasm of colon: Secondary | ICD-10-CM | POA: Diagnosis not present

## 2022-01-12 DIAGNOSIS — E6609 Other obesity due to excess calories: Secondary | ICD-10-CM

## 2022-01-12 DIAGNOSIS — R7303 Prediabetes: Secondary | ICD-10-CM

## 2022-01-12 DIAGNOSIS — Z6833 Body mass index (BMI) 33.0-33.9, adult: Secondary | ICD-10-CM

## 2022-01-12 DIAGNOSIS — F209 Schizophrenia, unspecified: Secondary | ICD-10-CM

## 2022-01-12 LAB — GLUCOSE, POCT (MANUAL RESULT ENTRY): POC Glucose: 96 mg/dl (ref 70–99)

## 2022-01-12 NOTE — Assessment & Plan Note (Signed)
Patient has a colonoscopy done this year

## 2022-01-12 NOTE — Assessment & Plan Note (Signed)
Patient is stable he does not have any behavior disorder.  No bad dreams hallucinations or delusions

## 2022-01-12 NOTE — Assessment & Plan Note (Signed)
Bipolar disorder is stable at the present time

## 2022-01-12 NOTE — Progress Notes (Signed)
Established Patient Office Visit  Subjective:  Patient ID: Drew Moss, male    DOB: [birth year]  Age: 51 y.o. MRN: [id number]  CC:  Chief Complaint  Patient presents with   Follow-up    Patient is here for his 3 month general check up.    HPI  Drew Moss presents for  general check up  Past Medical History:  Diagnosis Date   Bipolar affective ([place])    Schizophrenia ([place])     Past Surgical History:  Procedure Laterality Date   COLONOSCOPY WITH PROPOFOL N/A 08/17/2021   Procedure: COLONOSCOPY WITH PROPOFOL;  Surgeon: Lucilla Lame, MD;  Location: [facility] ENDOSCOPY;  Service: Endoscopy;  Laterality: N/A;    History reviewed. No pertinent family history.  Social History   Socioeconomic History   Marital status: Single    Spouse name: Not on file   Number of children: 0   Years of education: Not on file   Highest education level: 12th grade  Occupational History   Occupation: Disabed  Tobacco Use   Smoking status: Every Day    Packs/day: 0.50    Types: Cigarettes   Smokeless tobacco: Never  Vaping Use   Vaping Use: Never used  Substance and Sexual Activity   Alcohol use: Not Currently   Drug use: Never   Sexual activity: Not Currently  Other Topics Concern   Not on file  Social History Narrative   Not on file   Social Determinants of Health   Financial Resource Strain: Low Risk    Difficulty of Paying Living Expenses: Not hard at all  Food Insecurity: No Food Insecurity   Worried About Charity fundraiser in the Last Year: Never true   [place] in the Last Year: Never true  Transportation Needs: No Transportation Needs   Lack of Transportation (Medical): No   Lack of Transportation (Non-Medical): No  Physical Activity: Inactive   Days of Exercise per Week: 0 days   Minutes of Exercise per Session: 0 min  Stress: No Stress Concern Present   Feeling of Stress : Not at all  Social Connections: Socially Isolated   Frequency of Communication  with Friends and Family: Once a week   Frequency of Social Gatherings with Friends and Family: Never   Attends Religious Services: Never   Marine scientist or Organizations: No   Attends Music therapist: Never   Marital Status: Never married  Human resources officer Violence: Not At Risk   Fear of Current or Ex-Partner: No   Emotionally Abused: No   Physically Abused: No   Sexually Abused: No     Current Outpatient Medications:    acetaminophen (TYLENOL) 325 MG tablet, Take 650 mg by mouth 3 (three) times daily., Disp: , Rfl:    ARIPiprazole (ABILIFY) 5 MG tablet, Take 5 mg by mouth daily., Disp: , Rfl:    OLANZapine (ZYPREXA) 20 MG tablet, Take 20 mg by mouth at bedtime., Disp: , Rfl:    No Known Allergies  ROS Review of Systems  Constitutional: Negative.   HENT: Negative.    Eyes: Negative.   Respiratory: Negative.    Cardiovascular: Negative.   Gastrointestinal: Negative.   Endocrine: Negative.   Genitourinary: Negative.   Musculoskeletal: Negative.   Skin: Negative.   Allergic/Immunologic: Negative.   Neurological: Negative.   Hematological: Negative.   Psychiatric/Behavioral: Negative.    All other systems reviewed and are negative.    Objective:    Physical Exam Vitals  reviewed.  Constitutional:      Appearance: Normal appearance.  HENT:     Mouth/Throat:     Mouth: Mucous membranes are moist.  Eyes:     Pupils: Pupils are equal, round, and reactive to light.  Neck:     Vascular: No carotid bruit.  Cardiovascular:     Rate and Rhythm: Normal rate and regular rhythm.     Pulses: Normal pulses.     Heart sounds: Normal heart sounds.  Pulmonary:     Effort: Pulmonary effort is normal.     Breath sounds: Normal breath sounds.  Abdominal:     General: Bowel sounds are normal.     Palpations: Abdomen is soft. There is no hepatomegaly, splenomegaly or mass.     Tenderness: There is no abdominal tenderness.     Hernia: No hernia is present.   Musculoskeletal:     Cervical back: Neck supple.     Right lower leg: No edema.     Left lower leg: No edema.  Skin:    Findings: No rash.  Neurological:     Mental Status: He is alert and oriented to person, place, and time.     Motor: No weakness.  Psychiatric:        Mood and Affect: Mood normal.        Behavior: Behavior normal.    BP 134/90   Pulse 63   Ht 5' 4.5" (1.638 m)   Wt 197 lb 3.2 oz (89.4 kg)   BMI 33.33 kg/m  Wt Readings from Last 3 Encounters:  01/12/22 197 lb 3.2 oz (89.4 kg)  10/27/21 197 lb 1.6 oz (89.4 kg)  10/26/21 197 lb 1.6 oz (89.4 kg)     Health Maintenance Due  Topic Date Due   COVID-19 Vaccine (1) Never done    There are no preventive care reminders to display for this patient.  Lab Results  Component Value Date   TSH 2.25 07/13/2021   Lab Results  Component Value Date   WBC 4.5 10/26/2021   HGB 15.2 10/26/2021   HCT 45.6 10/26/2021   MCV 85.7 10/26/2021   PLT 203 10/26/2021   Lab Results  Component Value Date   NA 135 10/26/2021   K 3.7 10/26/2021   CO2 23 10/26/2021   GLUCOSE 121 (H) 10/26/2021   BUN 12 10/26/2021   CREATININE 0.99 10/26/2021   BILITOT 0.8 10/26/2021   ALKPHOS 69 10/26/2021   AST 20 10/26/2021   ALT 15 10/26/2021   PROT 7.9 10/26/2021   ALBUMIN 4.2 10/26/2021   CALCIUM 9.3 10/26/2021   ANIONGAP 9 10/26/2021   EGFR 97 07/13/2021   Lab Results  Component Value Date   CHOL 169 07/13/2021   Lab Results  Component Value Date   HDL 45 07/13/2021   Lab Results  Component Value Date   LDLCALC 106 (H) 07/13/2021   Lab Results  Component Value Date   TRIG 86 07/13/2021   Lab Results  Component Value Date   CHOLHDL 3.8 07/13/2021   No results found for: HGBA1C    Assessment & Plan:   Problem List Items Addressed This Visit       Other   Schizophrenia in remission [facility])    Patient is stable he does not have any behavior disorder.  No bad dreams hallucinations or delusions        Bipolar 1 disorder, depressed, full remission ([place]) - Primary    Bipolar disorder is stable at the  present time       Class 1 obesity due to excess calories without serious comorbidity with body mass index (BMI) of 33.0 to 33.9 in adult    Behavioral modification strategies: increasing lean protein intake, decreasing simple carbohydrates, increasing vegetables, increasing water intake, decreasing eating out, no skipping meals, meal planning and cooking strategies, keeping healthy foods in the home and planning for success.       Smoker    Counseled patient on the dangers of tobacco use, advised patient to stop smoking, and reviewed strategies to maximize success Smoking cessation instruction/counseling given:  counseled patient on the dangers of tobacco use, advised patient to stop smoking, and reviewed strategies to maximize success It is very important that pt quit smoking. There are various alternatives available to help with this difficult task, but first and foremost, pt must make a firm commitment and decision to quit. The nature of nicotine addiction is discussed. The usefulness of behavioral therapy is discussed and suggested.  The correct use, cost and side effects of nicotine replacement therapy such as gum or patches is discussed. Bupropion and its cost (sometimes not covered fully by insurance) and side effects are reviewed. The quit rates are discussed. I recommend pt not allow potential costs of treatment to deter ptfrom using nicotine replacement therapy or bupropion, as the long term economic and health benefits are obvious.        Colon cancer screening    Patient has a colonoscopy done this year       Other Visit Diagnoses     Prediabetes       Relevant Orders   POCT glucose (manual entry) (Completed)     Patient was advised to quit smoking.  He was advised to lose weight.  I checked his blood sugar today and it is within normal range.  Low-cholesterol low-salt and  antidiabetic diet was discussed with him.  No orders of the defined types were placed in this encounter.   Follow-up: No follow-ups on file.    Cletis Athens, MD

## 2022-01-12 NOTE — Assessment & Plan Note (Signed)
Behavioral modification strategies: increasing lean protein intake, decreasing simple carbohydrates, increasing vegetables, increasing water intake, decreasing eating out, no skipping meals, meal planning and cooking strategies, keeping healthy foods in the home and planning for success. 

## 2022-01-12 NOTE — Assessment & Plan Note (Signed)

## 2022-02-01 DIAGNOSIS — H524 Presbyopia: Secondary | ICD-10-CM | POA: Diagnosis not present

## 2022-03-16 ENCOUNTER — Encounter: Payer: Self-pay | Admitting: Internal Medicine

## 2022-03-16 ENCOUNTER — Ambulatory Visit (INDEPENDENT_AMBULATORY_CARE_PROVIDER_SITE_OTHER): Payer: Medicare Other | Admitting: Internal Medicine

## 2022-03-16 VITALS — BP 138/88 | HR 56 | Ht 64.5 in | Wt 199.6 lb

## 2022-03-16 DIAGNOSIS — R7303 Prediabetes: Secondary | ICD-10-CM

## 2022-03-16 DIAGNOSIS — F3176 Bipolar disorder, in full remission, most recent episode depressed: Secondary | ICD-10-CM

## 2022-03-16 DIAGNOSIS — E6609 Other obesity due to excess calories: Secondary | ICD-10-CM | POA: Diagnosis not present

## 2022-03-16 DIAGNOSIS — Z6833 Body mass index (BMI) 33.0-33.9, adult: Secondary | ICD-10-CM

## 2022-03-16 DIAGNOSIS — F17200 Nicotine dependence, unspecified, uncomplicated: Secondary | ICD-10-CM

## 2022-03-16 DIAGNOSIS — F209 Schizophrenia, unspecified: Secondary | ICD-10-CM

## 2022-03-16 NOTE — Assessment & Plan Note (Signed)
Bipolar disorder is in remission

## 2022-03-16 NOTE — Assessment & Plan Note (Signed)
-   I instructed the patient to stop smoking and provided them with smoking cessation materials.  - I informed the patient that smoking puts them at increased risk for cancer, COPD, hypertension, and more.  - Informed the patient to seek help if they begin to have trouble breathing, develop chest pain, start to cough up blood, feel faint, or pass out.  

## 2022-03-16 NOTE — Progress Notes (Signed)
Established Patient Office Visit  Subjective:  Patient ID: Drew Moss, male    DOB: 14-Jun-1971  Age: 51 y.o. MRN: [id number]  CC:  Chief Complaint  Patient presents with   Follow-up    HPI  Drew Moss presents for general check  Past Medical History:  Diagnosis Date   Bipolar affective ([place])    Schizophrenia ([place])     Past Surgical History:  Procedure Laterality Date   COLONOSCOPY WITH PROPOFOL N/A 08/17/2021   Procedure: COLONOSCOPY WITH PROPOFOL;  Surgeon: Lucilla Lame, MD;  Location: [facility] ENDOSCOPY;  Service: Endoscopy;  Laterality: N/A;    History reviewed. No pertinent family history.  Social History   Socioeconomic History   Marital status: Single    Spouse name: Not on file   Number of children: 0   Years of education: Not on file   Highest education level: 12th grade  Occupational History   Occupation: Disabed  Tobacco Use   Smoking status: Every Day    Packs/day: 0.50    Types: Cigarettes   Smokeless tobacco: Never  Vaping Use   Vaping Use: Never used  Substance and Sexual Activity   Alcohol use: Not Currently   Drug use: Never   Sexual activity: Not Currently  Other Topics Concern   Not on file  Social History Narrative   Not on file   Social Determinants of Health   Financial Resource Strain: Low Risk  (07/10/2021)   Overall Financial Resource Strain (CARDIA)    Difficulty of Paying Living Expenses: Not hard at all  Food Insecurity: No Food Insecurity (07/10/2021)   Hunger Vital Sign    Worried About Running Out of Food in the Last Year: Never true    [place] in the Last Year: Never true  Transportation Needs: No Transportation Needs (07/10/2021)   PRAPARE - Hydrologist (Medical): No    Lack of Transportation (Non-Medical): No  Physical Activity: Inactive (07/10/2021)   Exercise Vital Sign    Days of Exercise per Week: 0 days    Minutes of Exercise per Session: 0 min  Stress: No Stress  Concern Present (07/10/2021)   [place]    Feeling of Stress : Not at all  Social Connections: Socially Isolated (07/10/2021)   Social Connection and Isolation Panel [NHANES]    Frequency of Communication with Friends and Family: Once a week    Frequency of Social Gatherings with Friends and Family: Never    Attends Religious Services: Never    Marine scientist or Organizations: No    Attends Archivist Meetings: Never    Marital Status: Never married  Intimate Partner Violence: Not At Risk (07/10/2021)   Humiliation, Afraid, Rape, and Kick questionnaire    Fear of Current or Ex-Partner: No    Emotionally Abused: No    Physically Abused: No    Sexually Abused: No     Current Outpatient Medications:    acetaminophen (TYLENOL) 325 MG tablet, Take 650 mg by mouth 3 (three) times daily., Disp: , Rfl:    ARIPiprazole (ABILIFY) 5 MG tablet, Take 5 mg by mouth daily., Disp: , Rfl:    OLANZapine (ZYPREXA) 20 MG tablet, Take 20 mg by mouth at bedtime., Disp: , Rfl:    No Known Allergies  ROS Review of Systems  Constitutional: Negative.   HENT: Negative.    Eyes: Negative.   Respiratory: Negative.  Cardiovascular: Negative.  Negative for chest pain.  Endocrine: Negative.   Genitourinary: Negative.  Negative for frequency.  Skin: Negative.   Allergic/Immunologic: Negative.   Neurological: Negative.  Negative for light-headedness.  Hematological: Negative.  Negative for adenopathy.  Psychiatric/Behavioral: Negative.    All other systems reviewed and are negative.     Objective:    Physical Exam Vitals reviewed.  Constitutional:      Appearance: Normal appearance.  HENT:     Mouth/Throat:     Mouth: Mucous membranes are moist.  Eyes:     Pupils: Pupils are equal, round, and reactive to light.  Neck:     Vascular: No carotid bruit.  Cardiovascular:     Rate and Rhythm: Normal rate and  regular rhythm.     Pulses: Normal pulses.     Heart sounds: Normal heart sounds.  Pulmonary:     Effort: Pulmonary effort is normal.     Breath sounds: Normal breath sounds.  Abdominal:     General: Bowel sounds are normal.     Palpations: Abdomen is soft. There is no hepatomegaly, splenomegaly or mass.     Tenderness: There is no abdominal tenderness.     Hernia: No hernia is present.  Musculoskeletal:     Cervical back: Neck supple.     Right lower leg: No edema.     Left lower leg: No edema.  Skin:    Findings: No rash.  Neurological:     Mental Status: He is alert and oriented to person, place, and time.     Motor: No weakness.  Psychiatric:        Mood and Affect: Mood normal.        Behavior: Behavior normal.     BP 138/88   Pulse (!) 56   Ht 5' 4.5" (1.638 m)   Wt 199 lb 9.6 oz (90.5 kg)   BMI 33.73 kg/m  Wt Readings from Last 3 Encounters:  03/16/22 199 lb 9.6 oz (90.5 kg)  01/12/22 197 lb 3.2 oz (89.4 kg)  10/27/21 197 lb 1.6 oz (89.4 kg)     Health Maintenance Due  Topic Date Due   COVID-19 Vaccine (1) Never done   INFLUENZA VACCINE  03/08/2022    There are no preventive care reminders to display for this patient.  Lab Results  Component Value Date   TSH 2.25 07/13/2021   Lab Results  Component Value Date   WBC 4.5 10/26/2021   HGB 15.2 10/26/2021   HCT 45.6 10/26/2021   MCV 85.7 10/26/2021   PLT 203 10/26/2021   Lab Results  Component Value Date   NA 135 10/26/2021   K 3.7 10/26/2021   CO2 23 10/26/2021   GLUCOSE 121 (H) 10/26/2021   BUN 12 10/26/2021   CREATININE 0.99 10/26/2021   BILITOT 0.8 10/26/2021   ALKPHOS 69 10/26/2021   AST 20 10/26/2021   ALT 15 10/26/2021   PROT 7.9 10/26/2021   ALBUMIN 4.2 10/26/2021   CALCIUM 9.3 10/26/2021   ANIONGAP 9 10/26/2021   EGFR 97 07/13/2021   Lab Results  Component Value Date   CHOL 169 07/13/2021   Lab Results  Component Value Date   HDL 45 07/13/2021   Lab Results  Component  Value Date   LDLCALC 106 (H) 07/13/2021   Lab Results  Component Value Date   TRIG 86 07/13/2021   Lab Results  Component Value Date   CHOLHDL 3.8 07/13/2021   No results found for: "  HGBA1C"    Assessment & Plan:   Problem List Items Addressed This Visit       Other   Schizophrenia in remission ([facility])    He does not have any hallucinations or delusions, he is sleeping good, no signs of hyperactivity      Bipolar 1 disorder, depressed, full remission ([facility]) - Primary    Bipolar disorder is in remission      Class 1 obesity due to excess calories without serious comorbidity with body mass index (BMI) of 33.0 to 33.9 in adult    Patient was advised to make every effort to lose weight.  Diet has been explained to him.      Smoker    - I instructed the patient to stop smoking and provided them with smoking cessation materials.  - I informed the patient that smoking puts them at increased risk for cancer, COPD, hypertension, and more.  - Informed the patient to seek help if they begin to have trouble breathing, develop chest pain, start to cough up blood, feel faint, or pass out.       No orders of the defined types were placed in this encounter.   Follow-up: No follow-ups on file.     , MD 

## 2022-03-16 NOTE — Assessment & Plan Note (Signed)
Patient was advised to make every effort to lose weight.  Diet has been explained to him.

## 2022-03-16 NOTE — Assessment & Plan Note (Signed)
He does not have any hallucinations or delusions, he is sleeping good, no signs of hyperactivity

## 2022-03-17 NOTE — Addendum Note (Signed)
Addended by: Alois Cliche on: 03/17/2022 12:37 PM   Modules accepted: Orders

## 2022-03-29 ENCOUNTER — Ambulatory Visit (INDEPENDENT_AMBULATORY_CARE_PROVIDER_SITE_OTHER): Payer: Medicare Other | Admitting: Podiatry

## 2022-03-29 DIAGNOSIS — B353 Tinea pedis: Secondary | ICD-10-CM | POA: Diagnosis not present

## 2022-03-29 MED ORDER — CLOTRIMAZOLE-BETAMETHASONE 1-0.05 % EX CREA: 1.0000 | TOPICAL_CREAM | Freq: Two times a day (BID) | CUTANEOUS | 1 refills | Status: AC

## 2022-03-29 MED ORDER — TERBINAFINE HCL 250 MG PO TABS: 250.0000 mg | ORAL_TABLET | Freq: Every day | ORAL | 0 refills | Status: DC

## 2022-03-29 NOTE — Progress Notes (Signed)
   Chief Complaint  Patient presents with   Tinea Pedis    Patient is here for Tinea Pedis.patient also requesting toe nail trim.   foot care    HPI: 51 y.o. male presenting today as a new patient for evaluation of itching and burning between the toes bilateral feet.  Patient is concerned for possible athlete's foot.  This has been ongoing for few months now.  They have not done anything currently for treatment.  Past Medical History:  Diagnosis Date   Bipolar affective ([place])    Schizophrenia ([place])     Past Surgical History:  Procedure Laterality Date   COLONOSCOPY WITH PROPOFOL N/A 08/17/2021   Procedure: COLONOSCOPY WITH PROPOFOL;  Surgeon: Lucilla Lame, MD;  Location: [facility] ENDOSCOPY;  Service: Endoscopy;  Laterality: N/A;    No Known Allergies   Physical Exam: General: The patient is alert and oriented x3 in no acute distress.  Dermatology: Skin is warm, dry and supple bilateral lower extremities.  There is a maceration noted to the interdigital areas of the bilateral feet.  No open wounds.  Diffuse hyperkeratosis of the skin also noted on the plantar weightbearing surfaces of the feet  Vascular: Palpable pedal pulses bilaterally. Capillary refill within normal limits.  Negative for any significant edema or erythema  Neurological: Light touch and protective threshold grossly intact  Musculoskeletal Exam: No pedal deformities noted.  Pruritus with itching and burning sensation noted to the bilateral feet and interdigital areas.  Assessment: 1.  Tinea pedis bilateral with maceration interdigital areas   Plan of Care:  1. Patient evaluated.  2.  Prescription for Lotrisone cream apply 2 times daily 3.  Prescription for Lamisil 2 and 50 mg #30 daily.  Patient denies a history of liver pathology or symptoms 4.  Return to clinic 1 month     Edrick Kins, DPM [facility]  Dr. Edrick Kins, DPM    [address], [place] [address]                Office [phone]  Fax [phone]

## 2022-04-29 ENCOUNTER — Ambulatory Visit (INDEPENDENT_AMBULATORY_CARE_PROVIDER_SITE_OTHER): Payer: Medicare Other | Admitting: Podiatry

## 2022-04-29 DIAGNOSIS — B353 Tinea pedis: Secondary | ICD-10-CM | POA: Diagnosis not present

## 2022-04-29 MED ORDER — TERBINAFINE HCL 250 MG PO TABS: 250.0000 mg | ORAL_TABLET | Freq: Every day | ORAL | 0 refills | Status: AC

## 2022-04-29 NOTE — Progress Notes (Signed)
   Chief Complaint  Patient presents with   foot care    Patient is here for routine foot care, he also has athletes foot.     HPI: 51 y.o. 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  Patient states that he is feeling much better.  He completed the oral Lamisil and has been applying the Lotrisone cream as needed.  No new complaints at this time  Past Medical History:  Diagnosis Date   Bipolar affective ([place])    Schizophrenia ([place])     Past Surgical History:  Procedure Laterality Date   COLONOSCOPY WITH PROPOFOL N/A 08/17/2021   Procedure: COLONOSCOPY WITH PROPOFOL;  Surgeon: Lucilla Lame, MD;  Location: [facility] ENDOSCOPY;  Service: Endoscopy;  Laterality: N/A;    No Known Allergies   Physical Exam: General: The patient is alert and oriented x3 in no acute distress.  Dermatology: Skin is warm, dry and supple bilateral lower extremities.  Maceration resolved.  There continues to be some diffuse hyperkeratosis of the skinon the plantar weightbearing surfaces of the feet and interdigital areas  Vascular: Palpable pedal pulses bilaterally. Capillary refill within normal limits.  Negative for any significant edema or erythema  Neurological: Light touch and protective threshold grossly intact  Musculoskeletal Exam: No pedal deformities noted.    Assessment: 1.  Tinea pedis bilateral    Plan of Care:  1. Patient evaluated.  2.  Continue prescription for Lotrisone cream apply 2 times daily as needed 3.  Refill prescription for Lamisil 2 and 50 mg #30 daily.   4.  Return to clinic as needed     Edrick Kins, DPM [facility]  Dr. Edrick Kins, DPM    [address], [place] [address]                Office [phone]  Fax [phone]

## 2022-05-16 IMAGING — CR DG CHEST 2V
2 series · 2 of 2 positions shown · non-contrast
Comparison: None.

CLINICAL DATA: Chest pain

EXAM:
CHEST - 2 VIEW

[chest pa]
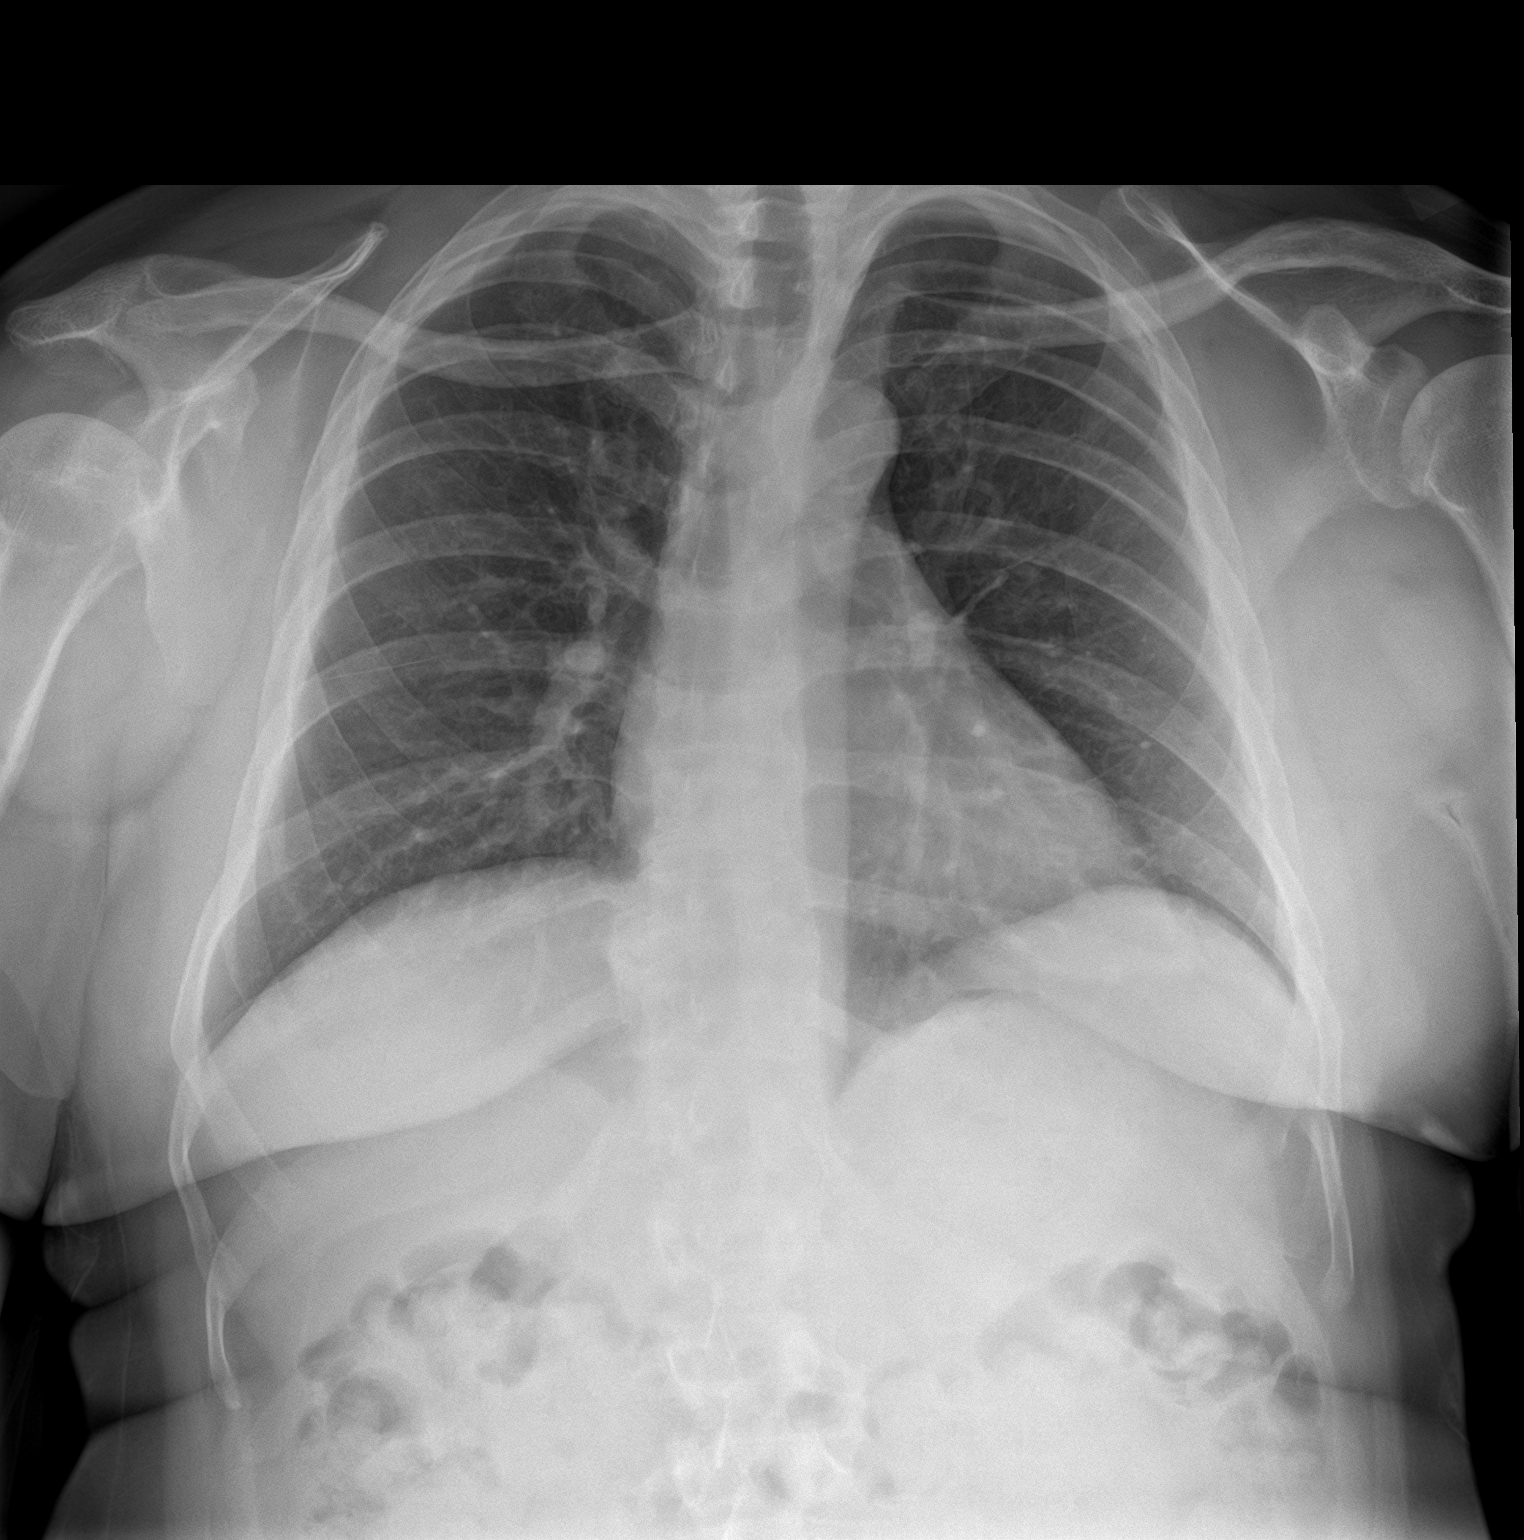

[chest lat]
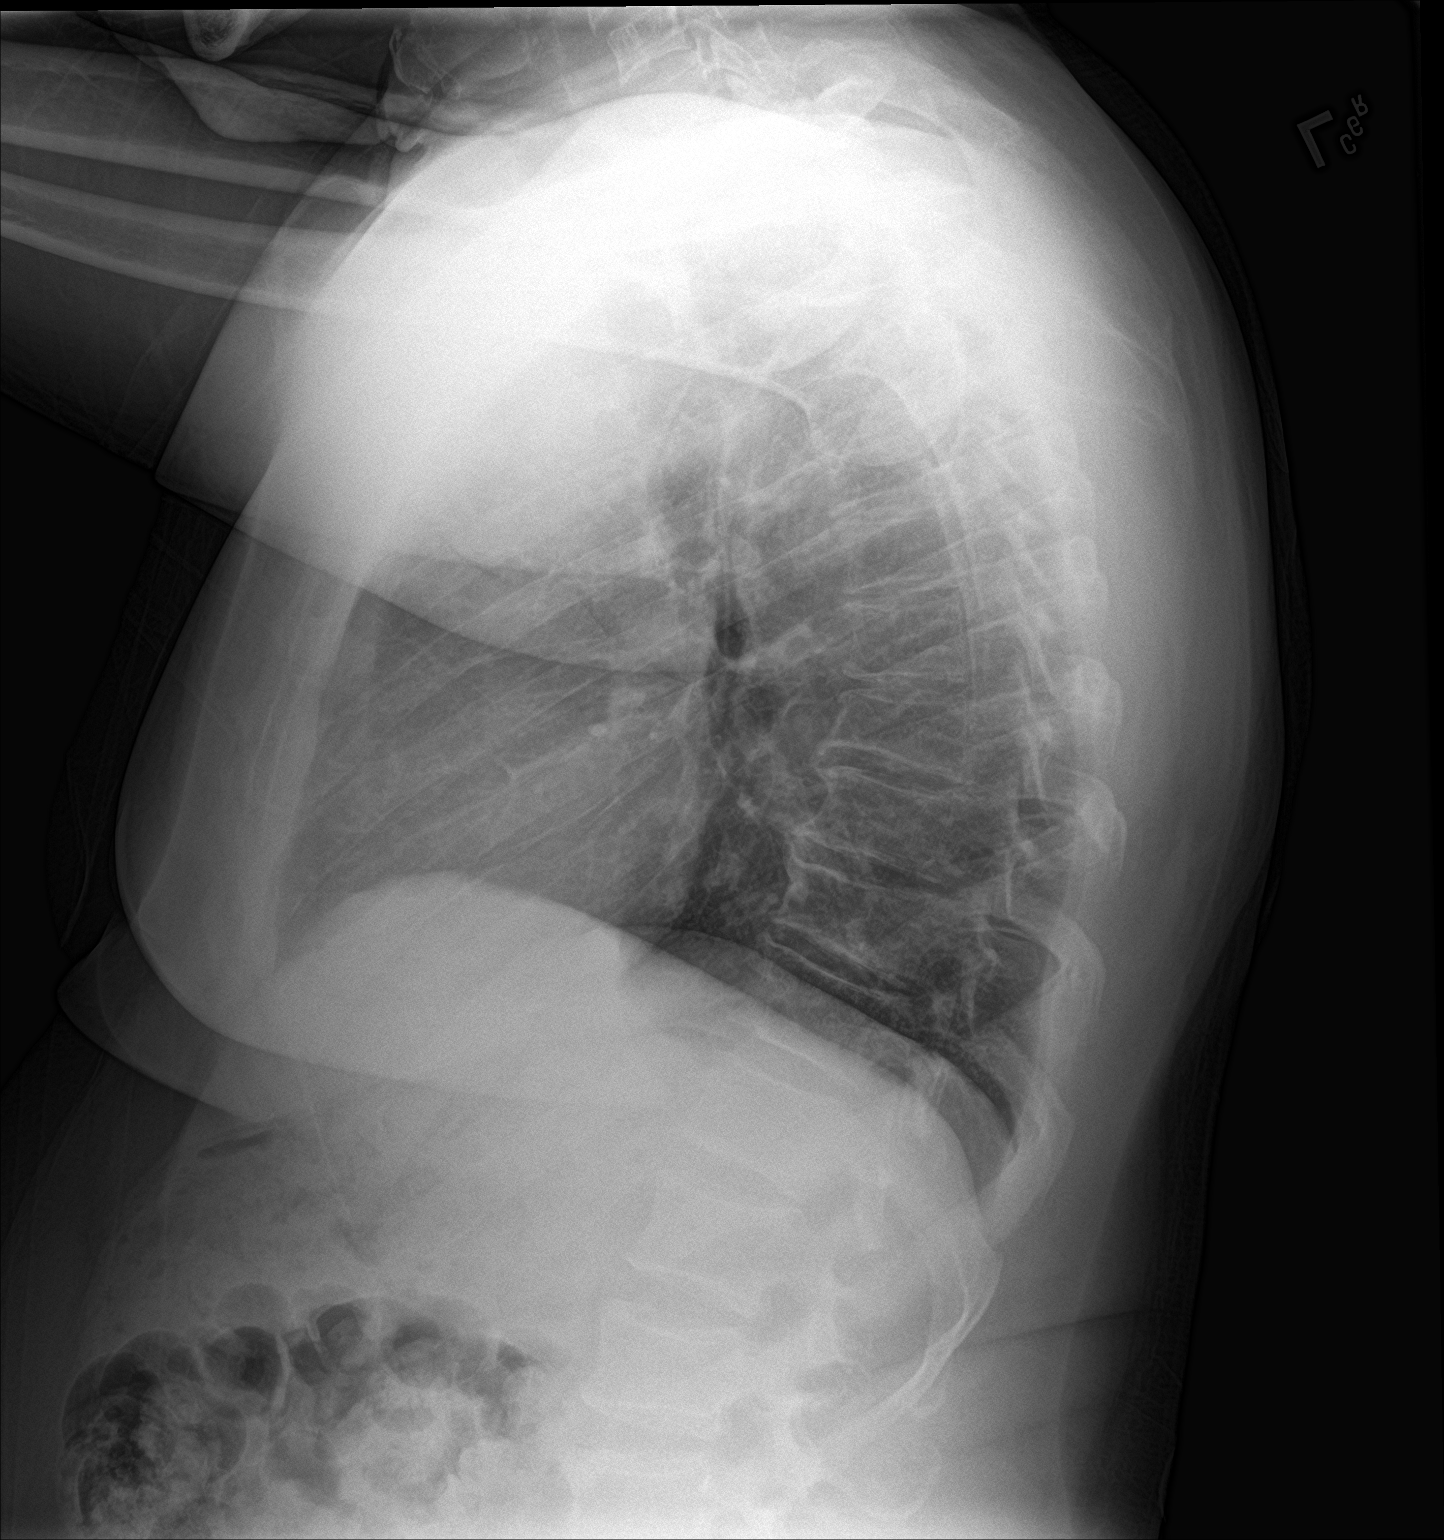

[2 of 2 positions shown; findings below may reference images not displayed]

FINDINGS: The heart size and mediastinal contours are within normal limits.
Both lungs are clear. The visualized skeletal structures are
unremarkable.
IMPRESSION: No active cardiopulmonary disease.

## 2022-07-19 ENCOUNTER — Ambulatory Visit (INDEPENDENT_AMBULATORY_CARE_PROVIDER_SITE_OTHER): Payer: Medicare Other | Admitting: Internal Medicine

## 2022-07-19 ENCOUNTER — Encounter: Payer: Self-pay | Admitting: Internal Medicine

## 2022-07-19 VITALS — BP 122/84 | HR 65 | Ht 64.0 in | Wt 203.7 lb

## 2022-07-19 DIAGNOSIS — Z6833 Body mass index (BMI) 33.0-33.9, adult: Secondary | ICD-10-CM

## 2022-07-19 DIAGNOSIS — F3176 Bipolar disorder, in full remission, most recent episode depressed: Secondary | ICD-10-CM

## 2022-07-19 DIAGNOSIS — E6609 Other obesity due to excess calories: Secondary | ICD-10-CM

## 2022-07-19 DIAGNOSIS — F17210 Nicotine dependence, cigarettes, uncomplicated: Secondary | ICD-10-CM | POA: Diagnosis not present

## 2022-07-19 DIAGNOSIS — F17200 Nicotine dependence, unspecified, uncomplicated: Secondary | ICD-10-CM | POA: Diagnosis not present

## 2022-07-19 DIAGNOSIS — Z0000 Encounter for general adult medical examination without abnormal findings: Secondary | ICD-10-CM

## 2022-07-19 DIAGNOSIS — R7303 Prediabetes: Secondary | ICD-10-CM

## 2022-07-19 DIAGNOSIS — F209 Schizophrenia, unspecified: Secondary | ICD-10-CM | POA: Diagnosis not present

## 2022-07-19 LAB — GLUCOSE, POCT (MANUAL RESULT ENTRY): POC Glucose: 91 mg/dl (ref 70–99)

## 2022-07-19 NOTE — Progress Notes (Signed)
Established Patient Office Visit  Subjective:  Patient ID: Drew Moss, male    DOB: Dec 24, 1970  Age: 51 y.o. MRN: [id number]  CC: No chief complaint on file.   HPI  Drew Moss presents for check up  Past Medical History:  Diagnosis Date   Bipolar affective ([place])    Schizophrenia ([place])     Past Surgical History:  Procedure Laterality Date   COLONOSCOPY WITH PROPOFOL N/A 08/17/2021   Procedure: COLONOSCOPY WITH PROPOFOL;  Surgeon: Lucilla Lame, MD;  Location: [facility] ENDOSCOPY;  Service: Endoscopy;  Laterality: N/A;    History reviewed. No pertinent family history.  Social History   Socioeconomic History   Marital status: Single    Spouse name: Not on file   Number of children: 0   Years of education: Not on file   Highest education level: 12th grade  Occupational History   Occupation: Disabed  Tobacco Use   Smoking status: Every Day    Packs/day: 0.50    Types: Cigarettes   Smokeless tobacco: Never  Vaping Use   Vaping Use: Never used  Substance and Sexual Activity   Alcohol use: Not Currently   Drug use: Never   Sexual activity: Not Currently  Other Topics Concern   Not on file  Social History Narrative   Not on file   Social Determinants of Health   Financial Resource Strain: Low Risk  (07/10/2021)   Overall Financial Resource Strain (CARDIA)    Difficulty of Paying Living Expenses: Not hard at all  Food Insecurity: No Food Insecurity (07/10/2021)   Hunger Vital Sign    Worried About Running Out of Food in the Last Year: Never true    [place] in the Last Year: Never true  Transportation Needs: No Transportation Needs (07/10/2021)   PRAPARE - Hydrologist (Medical): No    Lack of Transportation (Non-Medical): No  Physical Activity: Inactive (07/10/2021)   Exercise Vital Sign    Days of Exercise per Week: 0 days    Minutes of Exercise per Session: 0 min  Stress: No Stress Concern Present (07/10/2021)   [place]    Feeling of Stress : Not at all  Social Connections: Socially Isolated (07/10/2021)   Social Connection and Isolation Panel [NHANES]    Frequency of Communication with Friends and Family: Once a week    Frequency of Social Gatherings with Friends and Family: Never    Attends Religious Services: Never    Marine scientist or Organizations: No    Attends Archivist Meetings: Never    Marital Status: Never married  Intimate Partner Violence: Not At Risk (07/10/2021)   Humiliation, Afraid, Rape, and Kick questionnaire    Fear of Current or Ex-Partner: No    Emotionally Abused: No    Physically Abused: No    Sexually Abused: No     Current Outpatient Medications:    acetaminophen (TYLENOL) 325 MG tablet, Take 650 mg by mouth 3 (three) times daily., Disp: , Rfl:    ARIPiprazole (ABILIFY) 5 MG tablet, Take 5 mg by mouth daily., Disp: , Rfl:    clotrimazole-betamethasone (LOTRISONE) cream, Apply 1 Application topically 2 (two) times daily., Disp: 45 g, Rfl: 1   OLANZapine (ZYPREXA) 20 MG tablet, Take 20 mg by mouth at bedtime., Disp: , Rfl:    terbinafine (LAMISIL) 250 MG tablet, Take 1 tablet (250 mg total) by mouth daily. (  Patient not taking: Reported on 07/19/2022), Disp: 30 tablet, Rfl: 0   No Known Allergies  ROS Review of Systems  Constitutional: Negative.   HENT: Negative.    Eyes: Negative.   Respiratory: Negative.    Cardiovascular: Negative.   Gastrointestinal: Negative.   Endocrine: Negative.   Genitourinary: Negative.   Musculoskeletal: Negative.   Skin: Negative.   Allergic/Immunologic: Negative.   Neurological: Negative.   Hematological: Negative.   Psychiatric/Behavioral: Negative.    All other systems reviewed and are negative.     Objective:    Physical Exam Vitals reviewed.  Constitutional:      Appearance: Normal appearance.  HENT:     Mouth/Throat:     Mouth: Mucous  membranes are moist.  Eyes:     Pupils: Pupils are equal, round, and reactive to light.  Neck:     Vascular: No carotid bruit.  Cardiovascular:     Rate and Rhythm: Normal rate and regular rhythm.     Pulses: Normal pulses.     Heart sounds: Normal heart sounds.  Pulmonary:     Effort: Pulmonary effort is normal.     Breath sounds: Normal breath sounds.  Abdominal:     General: Bowel sounds are normal.     Palpations: Abdomen is soft. There is no hepatomegaly, splenomegaly or mass.     Tenderness: There is no abdominal tenderness.     Hernia: No hernia is present.  Musculoskeletal:     Cervical back: Neck supple.     Right lower leg: No edema.     Left lower leg: No edema.  Skin:    Findings: No rash.  Neurological:     Mental Status: He is alert and oriented to person, place, and time.     Motor: No weakness.  Psychiatric:        Mood and Affect: Mood normal.        Behavior: Behavior normal.     BP 122/84   Pulse 65   Ht _0  (1.626 m)   Wt 203 lb 11.2 oz (92.4 kg)   SpO2 97%   BMI 34.97 kg/m  Wt Readings from Last 3 Encounters:  07/19/22 203 lb 11.2 oz (92.4 kg)  03/16/22 199 lb 9.6 oz (90.5 kg)  01/12/22 197 lb 3.2 oz (89.4 kg)     Health Maintenance Due  Topic Date Due   COVID-19 Vaccine (1) Never done   INFLUENZA VACCINE  03/08/2022   Medicare Annual Wellness (AWV)  07/09/2022    There are no preventive care reminders to display for this patient.  Lab Results  Component Value Date   TSH 2.25 07/13/2021   Lab Results  Component Value Date   WBC 4.5 10/26/2021   HGB 15.2 10/26/2021   HCT 45.6 10/26/2021   MCV 85.7 10/26/2021   PLT 203 10/26/2021   Lab Results  Component Value Date   NA 135 10/26/2021   K 3.7 10/26/2021   CO2 23 10/26/2021   GLUCOSE 121 (H) 10/26/2021   BUN 12 10/26/2021   CREATININE 0.99 10/26/2021   BILITOT 0.8 10/26/2021   ALKPHOS 69 10/26/2021   AST 20 10/26/2021   ALT 15 10/26/2021   PROT 7.9 10/26/2021    ALBUMIN 4.2 10/26/2021   CALCIUM 9.3 10/26/2021   ANIONGAP 9 10/26/2021   EGFR 97 07/13/2021   Lab Results  Component Value Date   CHOL 169 07/13/2021   Lab Results  Component Value Date   HDL 45 07/13/2021  Lab Results  Component Value Date   LDLCALC 106 (H) 07/13/2021   Lab Results  Component Value Date   TRIG 86 07/13/2021   Lab Results  Component Value Date   CHOLHDL 3.8 07/13/2021   No results found for: "HGBA1C"    Assessment & Plan:   Problem List Items Addressed This Visit       Other   Schizophrenia in remission ([place])    Under control at present time      Bipolar 1 disorder, depressed, full remission ([place]) - Primary    Stable at the present time      Class 1 obesity due to excess calories without serious comorbidity with body mass index (BMI) of 33.0 to 33.9 in adult    Patient was advised to lose weight.- I encouraged the patient to lose weight.  - I educated them on making healthy dietary choices including eating more fruits and vegetables and less fried foods. - I encouraged the patient to exercise more, and educated on the benefits of exercise including weight loss, diabetes prevention, and hypertension prevention.   Dietary counseling with a registered dietician  Referral to a weight management support group (e.g. Weight Watchers, Overeaters Anonymous)  If your BMI is greater than 29 or you have gained more than 15 pounds you should work on weight loss.  Attend a healthy cooking class      Smoker    - I instructed the patient to stop smoking and provided them with smoking cessation materials.  - I informed the patient that smoking puts them at increased risk for cancer, COPD, hypertension, and more.  - Informed the patient to seek help if they begin to have trouble breathing, develop chest pain, start to cough up blood, feel faint, or pass out.      Annual physical exam    Patient denies any chest pain or shortness of breath.  Swelling of  the leg had decreased.  Heart is regular chest is clear abdomen is soft nontender without any hepatosplenomegaly.  There is no pedal edema.  Patient main problem meds obesity and he was advised to lose weight.  He need to quit smoking completely.  We are going to check his blood sugar today.  He is not due for the complete labs this time.       No orders of the defined types were placed in this encounter.   Follow-up: No follow-ups on file.    Cletis Athens, MD

## 2022-07-19 NOTE — Assessment & Plan Note (Signed)
-   I instructed the patient to stop smoking and provided them with smoking cessation materials.  - I informed the patient that smoking puts them at increased risk for cancer, COPD, hypertension, and more.  - Informed the patient to seek help if they begin to have trouble breathing, develop chest pain, start to cough up blood, feel faint, or pass out.  

## 2022-07-19 NOTE — Assessment & Plan Note (Signed)
Patient denies any chest pain or shortness of breath.  Swelling of the leg had decreased.  Heart is regular chest is clear abdomen is soft nontender without any hepatosplenomegaly.  There is no pedal edema.  Patient main problem meds obesity and he was advised to lose weight.  He need to quit smoking completely.  We are going to check his blood sugar today.  He is not due for the complete labs this time.

## 2022-07-19 NOTE — Assessment & Plan Note (Signed)
Stable at the present time. 

## 2022-07-19 NOTE — Assessment & Plan Note (Signed)
Under control at present time

## 2022-07-19 NOTE — Assessment & Plan Note (Signed)
Patient was advised to lose weight.- I encouraged the patient to lose weight.  - I educated them on making healthy dietary choices including eating more fruits and vegetables and less fried foods. - I encouraged the patient to exercise more, and educated on the benefits of exercise including weight loss, diabetes prevention, and hypertension prevention.   Dietary counseling with a registered dietician  Referral to a weight management support group (e.g. Weight Watchers, Overeaters Anonymous)  If your BMI is greater than 29 or you have gained more than 15 pounds you should work on weight loss.  Attend a healthy cooking class

## 2022-08-11 ENCOUNTER — Other Ambulatory Visit: Payer: Self-pay | Admitting: Internal Medicine

## 2022-09-16 ENCOUNTER — Other Ambulatory Visit: Payer: Self-pay

## 2022-09-16 ENCOUNTER — Ambulatory Visit (LOCAL_COMMUNITY_HEALTH_CENTER): Payer: 59

## 2022-09-16 DIAGNOSIS — Z111 Encounter for screening for respiratory tuberculosis: Secondary | ICD-10-CM

## 2022-09-19 ENCOUNTER — Ambulatory Visit (LOCAL_COMMUNITY_HEALTH_CENTER): Payer: Self-pay

## 2022-09-19 DIAGNOSIS — Z111 Encounter for screening for respiratory tuberculosis: Secondary | ICD-10-CM

## 2022-09-19 LAB — TB SKIN TEST
Induration: 0 mm
TB Skin Test: NEGATIVE

## 2022-09-27 ENCOUNTER — Other Ambulatory Visit: Payer: 59

## 2022-09-27 ENCOUNTER — Ambulatory Visit (LOCAL_COMMUNITY_HEALTH_CENTER): Payer: 59

## 2022-09-27 DIAGNOSIS — Z111 Encounter for screening for respiratory tuberculosis: Secondary | ICD-10-CM

## 2022-09-30 ENCOUNTER — Ambulatory Visit (LOCAL_COMMUNITY_HEALTH_CENTER): Payer: 59

## 2022-09-30 DIAGNOSIS — Z111 Encounter for screening for respiratory tuberculosis: Secondary | ICD-10-CM

## 2022-09-30 LAB — TB SKIN TEST
Induration: 0 mm
TB Skin Test: NEGATIVE

## 2023-08-08 DIAGNOSIS — E119 Type 2 diabetes mellitus without complications: Secondary | ICD-10-CM | POA: Diagnosis not present
